# Patient Record
Sex: Female | Born: 1980 | Race: Black or African American | Hispanic: No | Marital: Single | Smoking: Former smoker
Health system: Southern US, Community
[De-identification: ages and names within clinical notes are randomized; demographics above are authoritative.]

## PROBLEM LIST (undated history)

## (undated) DIAGNOSIS — I10 Essential (primary) hypertension: Secondary | ICD-10-CM

## (undated) HISTORY — PX: HERNIA REPAIR: SHX51

---
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
## 2009-02-25 HISTORY — PX: KNEE ARTHROSCOPY: SUR90

## 2014-11-02 LAB — BASIC METABOLIC PANEL
BUN: 10 (ref 4–21)
CO2: 29 — AB (ref 13–22)
Chloride: 102 (ref 99–108)
Creatinine: 0.9 (ref 0.5–1.1)
Glucose: 93
Potassium: 4 (ref 3.4–5.3)
Sodium: 140 (ref 137–147)

## 2014-11-02 LAB — COMPREHENSIVE METABOLIC PANEL: Calcium: 8.9 (ref 8.7–10.7)

## 2015-04-13 DIAGNOSIS — R55 Syncope and collapse: Secondary | ICD-10-CM

## 2015-04-13 HISTORY — DX: Syncope and collapse: R55

## 2015-04-14 LAB — IRON,TIBC AND FERRITIN PANEL
Ferritin: 10
Iron: 13
TIBC: 311

## 2015-04-14 LAB — CBC AND DIFFERENTIAL
HCT: 30 — AB (ref 36–46)
Hemoglobin: 9 — AB (ref 12.0–16.0)
Platelets: 375 (ref 150–399)
WBC: 9.2

## 2015-04-14 LAB — CBC: RBC: 4.17 (ref 3.87–5.11)

## 2015-04-14 LAB — TSH: TSH: 1.67 (ref 0.41–5.90)

## 2016-12-25 LAB — CBC AND DIFFERENTIAL
HCT: 36 (ref 36–46)
Hemoglobin: 11.2 — AB (ref 12.0–16.0)
Platelets: 295 (ref 150–399)
WBC: 8

## 2016-12-25 LAB — BASIC METABOLIC PANEL
BUN: 15 (ref 4–21)
CO2: 23 — AB (ref 13–22)
Chloride: 103 (ref 99–108)
Creatinine: 0.9 (ref 0.5–1.1)
Glucose: 81
Potassium: 3.6 (ref 3.4–5.3)
Sodium: 134 — AB (ref 137–147)

## 2016-12-25 LAB — CBC: RBC: 4.41 (ref 3.87–5.11)

## 2016-12-25 LAB — COMPREHENSIVE METABOLIC PANEL: Calcium: 8 — AB (ref 8.7–10.7)

## 2018-01-27 LAB — BASIC METABOLIC PANEL
BUN: 19 (ref 4–21)
CO2: 27 — AB (ref 13–22)
Chloride: 104 (ref 99–108)
Creatinine: 0.9 (ref 0.5–1.1)
Glucose: 95

## 2018-01-27 LAB — CBC AND DIFFERENTIAL
HCT: 36 (ref 36–46)
Hemoglobin: 11.7 — AB (ref 12.0–16.0)
Neutrophils Absolute: 8
Platelets: 309 (ref 150–399)
WBC: 11.9

## 2018-01-27 LAB — COMPREHENSIVE METABOLIC PANEL
Albumin: 3.7 (ref 3.5–5.0)
Calcium: 8.7 (ref 8.7–10.7)

## 2018-01-27 LAB — HEPATIC FUNCTION PANEL
ALT: 14 (ref 7–35)
Alkaline Phosphatase: 66 (ref 25–125)
Bilirubin, Total: 0.2

## 2018-01-27 LAB — CBC: RBC: 4.56 (ref 3.87–5.11)

## 2018-05-05 LAB — BASIC METABOLIC PANEL
BUN: 12 (ref 4–21)
CO2: 31 — AB (ref 13–22)
Chloride: 102 (ref 99–108)
Creatinine: 1 (ref 0.5–1.1)
Glucose: 88
Potassium: 4 (ref 3.4–5.3)
Sodium: 141 (ref 137–147)

## 2018-05-05 LAB — COMPREHENSIVE METABOLIC PANEL
Calcium: 8.9 (ref 8.7–10.7)
GFR calc Af Amer: 86
GFR calc non Af Amer: 75

## 2019-03-08 ENCOUNTER — Encounter (HOSPITAL_COMMUNITY): Payer: Self-pay | Admitting: Emergency Medicine

## 2019-03-08 ENCOUNTER — Emergency Department (HOSPITAL_COMMUNITY): Payer: Medicaid Other

## 2019-03-08 ENCOUNTER — Emergency Department (HOSPITAL_COMMUNITY)
Admission: EM | Admit: 2019-03-08 | Discharge: 2019-03-09 | Disposition: A | Discharge status: Home / Self Care | Payer: Medicaid Other | Attending: Emergency Medicine | Admitting: Emergency Medicine

## 2019-03-08 ENCOUNTER — Other Ambulatory Visit: Payer: Self-pay

## 2019-03-08 DIAGNOSIS — R112 Nausea with vomiting, unspecified: Secondary | ICD-10-CM | POA: Insufficient documentation

## 2019-03-08 DIAGNOSIS — Z20822 Contact with and (suspected) exposure to covid-19: Secondary | ICD-10-CM | POA: Insufficient documentation

## 2019-03-08 DIAGNOSIS — I10 Essential (primary) hypertension: Secondary | ICD-10-CM | POA: Insufficient documentation

## 2019-03-08 DIAGNOSIS — Z87891 Personal history of nicotine dependence: Secondary | ICD-10-CM | POA: Insufficient documentation

## 2019-03-08 DIAGNOSIS — R197 Diarrhea, unspecified: Secondary | ICD-10-CM | POA: Insufficient documentation

## 2019-03-08 DIAGNOSIS — F1210 Cannabis abuse, uncomplicated: Secondary | ICD-10-CM | POA: Insufficient documentation

## 2019-03-08 HISTORY — DX: Essential (primary) hypertension: I10

## 2019-03-08 LAB — CBC
HCT: 42.2 % (ref 36.0–46.0)
Hemoglobin: 13 g/dL (ref 12.0–15.0)
MCH: 25.9 pg — ABNORMAL LOW (ref 26.0–34.0)
MCHC: 30.8 g/dL (ref 30.0–36.0)
MCV: 84.1 fL (ref 80.0–100.0)
Platelets: 342 10*3/uL (ref 150–400)
RBC: 5.02 MIL/uL (ref 3.87–5.11)
RDW: 14.6 % (ref 11.5–15.5)
WBC: 10.4 10*3/uL (ref 4.0–10.5)
nRBC: 0 % (ref 0.0–0.2)

## 2019-03-08 LAB — TROPONIN I (HIGH SENSITIVITY)
Troponin I (High Sensitivity): 6 ng/L (ref <18)
Troponin I (High Sensitivity): 6 ng/L (ref <18)

## 2019-03-08 LAB — I-STAT BETA HCG BLOOD, ED (MC, WL, AP ONLY): I-stat hCG, quantitative: <5 m[IU]/mL (ref <5)

## 2019-03-08 LAB — BASIC METABOLIC PANEL
Anion gap: 9 (ref 5–15)
BUN: 12 mg/dL (ref 6–20)
CO2: 26 mmol/L (ref 22–32)
Calcium: 8.7 mg/dL — ABNORMAL LOW (ref 8.9–10.3)
Chloride: 101 mmol/L (ref 98–111)
Creatinine, Ser: 1.02 mg/dL — ABNORMAL HIGH (ref 0.44–1.00)
GFR calc Af Amer: >60 mL/min (ref >60)
GFR calc non Af Amer: >60 mL/min (ref >60)
Glucose, Bld: 97 mg/dL (ref 70–99)
Potassium: 3.7 mmol/L (ref 3.5–5.1)
Sodium: 136 mmol/L (ref 135–145)

## 2019-03-08 NOTE — ED Triage Notes (Signed)
Pt reports "feeling bad" x 2 weeks, was at work last night and started having central CP last night with SOB, reports epigastric abdominal pain with 2 episodes of light brown emesis. Denies CP currently.

## 2019-03-09 MED ORDER — ACETAMINOPHEN 500 MG PO TABS
1000.0000 mg | ORAL_TABLET | Freq: Once | ORAL | Status: AC
  Administered 2019-03-09: 05:00:00 1000 mg via ORAL
  Filled 2019-03-09: qty 2

## 2019-03-09 MED ORDER — ONDANSETRON 4 MG PO TBDP
4.0000 mg | ORAL_TABLET | Freq: Once | ORAL | Status: AC
  Administered 2019-03-09: 05:00:00 4 mg via ORAL
  Filled 2019-03-09: qty 1

## 2019-03-09 MED ORDER — FAMOTIDINE 20 MG PO TABS
20.0000 mg | ORAL_TABLET | Freq: Once | ORAL | Status: AC
  Administered 2019-03-09: 20 mg via ORAL
  Filled 2019-03-09: qty 1

## 2019-03-09 MED ORDER — ONDANSETRON 4 MG PO TBDP: 4.0000 mg | ORAL_TABLET | Freq: Three times a day (TID) | ORAL | 0 refills | Status: DC | PRN

## 2019-03-09 MED ORDER — PANTOPRAZOLE SODIUM 40 MG PO TBEC: 40.0000 mg | DELAYED_RELEASE_TABLET | Freq: Every day | ORAL | 0 refills | Status: DC

## 2019-03-09 NOTE — ED Provider Notes (Signed)
[facility] EMERGENCY DEPARTMENT Provider Note   CSN: [id number] Arrival date & time: 03/08/19  1750     History Chief Complaint  Patient presents with  . Vomiting  . Generalized Body Aches    Diane Haas is a 39 y.o. female.  The history is provided by the patient and medical records.    39 y.o. F with hx of HTN, stomach ulcer, presenting to the ED for generalized body aches, nausea, vomiting, loose stools, and generally feeling unwell.  States she felt somewhat bad yesterday as she did not want to eat/drink but worse today while at work so she left early.  States the last thing she ate was a [employer] breakfast sandwich but that is not unusual for her and it tasted fine.  She has had approx 3 episodes of nonbloody, nonbilious emesis today.  Has also had some loose stool.  No melena or hematochezia.  She denies any fever or chills.  She has not had any cough.  She did report chest pain earlier today but that seems to have resolved, this occurred after vomiting.  She denies any known cardiac history.   She is not currently on any exogenous estrogens.  No recent travel.  No history of DVT or PE.  She denies any sick contacts or known Covid exposures.  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  She does not feel like she has lost her sense of taste/smell.  Past Medical History:  Diagnosis Date  . Hypertension   . Stomach ulcer 1999    There are no problems to display for this patient.   History reviewed. No pertinent surgical history.   OB History   No obstetric history on file.     History reviewed. No pertinent family history.  Social History   Tobacco Use  . Smoking status: Former Smoker    Quit date: 02/22/2019    Years since quitting: 0.0  Substance Use Topics  . Alcohol  use: Yes    Comment: occ  . Drug use: Yes    Types: Marijuana    Comment: occ     Home Medications Prior to Admission medications   Not on File    Allergies    Patient has no allergy information on record.  Review of Systems   Review of Systems  Constitutional: Positive for fatigue.  Gastrointestinal: Positive for abdominal pain, diarrhea, nausea and vomiting.  Musculoskeletal: Positive for myalgias.  All other systems reviewed and are negative.   Physical Exam Updated Vital Signs BP (!) 166/104 (BP Location: Left Wrist)   Pulse 67   Temp 97.8 F (36.6 C) (Oral)   Resp 20   LMP 03/01/2019 (Approximate)   SpO2 100%   Physical Exam Vitals and nursing note reviewed.  Constitutional:      Appearance: She is well-developed.     Comments: obese  HENT:     Head: Normocephalic and atraumatic.     Nose:     Comments: Sounds congested Eyes:     Conjunctiva/sclera: Conjunctivae normal.     Pupils: Pupils are equal, round, and reactive to light.  Cardiovascular:     Rate and Rhythm: Normal rate and regular rhythm.     Heart sounds: Normal heart sounds.  Pulmonary:  Effort: Pulmonary effort is normal.     Breath sounds: Normal breath sounds.  Abdominal:     General: Bowel sounds are normal.     Palpations: Abdomen is soft.     Tenderness: There is no abdominal tenderness. There is no guarding or rebound.     Comments: Soft, non-tender  Musculoskeletal:        General: Normal range of motion.     Cervical back: Normal range of motion.  Skin:    General: Skin is warm and dry.  Neurological:     Mental Status: She is alert and oriented to person, place, and time.     ED Results / Procedures / Treatments   Labs (all labs ordered are listed, but only abnormal results are displayed) Labs Reviewed  BASIC METABOLIC PANEL - Abnormal; Notable for the following components:      Result Value   Creatinine, Ser 1.02 (*)    Calcium 8.7 (*)    All other components  within normal limits  CBC - Abnormal; Notable for the following components:   MCH 25.9 (*)    All other components within normal limits  NOVEL CORONAVIRUS, NAA (HOSP ORDER, SEND-OUT TO REF LAB; TAT 18-24 HRS)  I-STAT BETA HCG BLOOD, ED ([facility], [facility], [facility] ONLY)  TROPONIN I (HIGH SENSITIVITY)  TROPONIN I (HIGH SENSITIVITY)    EKG EKG Interpretation  Date/Time:  Tuesday March 09 2019 06:12:13 EST Ventricular Rate:  64 PR Interval:  152 QRS Duration: 92 QT Interval:  426 QTC Calculation: 439 R Axis:   56 Text Interpretation: Normal sinus rhythm Normal ECG No old tracing to compare Confirmed by Merrily Pew [phone]) on 03/09/2019 6:19:58 AM   Radiology DG Chest 2 View  Result Date: 03/08/2019 CLINICAL DATA:  Chest pain EXAM: CHEST - 2 VIEW COMPARISON:  None. FINDINGS: The heart size and mediastinal contours are within normal limits. Both lungs are clear. The visualized skeletal structures are unremarkable. IMPRESSION: No active cardiopulmonary disease. Electronically Signed   By: Davina Poke D.O.   On: 03/08/2019 19:22    Procedures Procedures (including critical care time)  Medications Ordered in ED Medications - No data to display  ED Course  I have reviewed the triage vital signs and the nursing notes.  Pertinent labs & imaging results that were available during my care of the patient were reviewed by me and considered in my medical decision making (see chart for details).    MDM Rules/Calculators/A&P  39 year old female presenting to the ED with various complaints, her biggest concern is generalized body aches and vomiting.  She has not had loss of taste or smell, fever, or cough.  She did have episode of chest pain earlier today, however this was after episode of vomiting.  She denies any current chest pain.  She is afebrile and nontoxic.  She does sound congested but lungs are clear without any noted wheezes or rhonchi.  Her abdomen is soft and benign.  Still feels  nauseated with a "upset stomach".  Has been having some loose stool as well.  Denies any melena or hematochezia.  Labs obtained from triage are overall reassuring including troponin x2.  Chest x-ray is clear.  EKG without acute ischemic changes.  Patient does have reported history of stomach ulcer, however symptoms today without focal abdominal pain, denies blood in emesis or stool.  Her biggest complaint is generalized body aches which given concurrent N/V/D is morning seems more concerning for viral etiology.  Will screen for  COVID-19 and treat symptomatically.   She is tolerating oral fluids here, no active emesis or diarrhea in ED.  Feel she is stable for discharge.    She used to be on protonix for control of GERD, may be useful to restart this given her vomiting.  Continue zofran PRN, push oral fluids, rest, etc.  She can follow-up with PCP.  Return here for any new/acute changes.  Diane Haas was evaluated in Emergency Department on 03/09/2019 for the symptoms described in the history of present illness. She was evaluated in the context of the global COVID-19 pandemic, which necessitated consideration that the patient might be at risk for infection with the SARS-CoV-2 virus that causes COVID-19. Institutional protocols and algorithms that pertain to the evaluation of patients at risk for COVID-19 are in a state of rapid change based on information released by regulatory bodies including the CDC and federal and state organizations. These policies and algorithms were followed during the patient's care in the ED.   Final Clinical Impression(s) / ED Diagnoses Final diagnoses:  Non-intractable vomiting with nausea, unspecified vomiting type    Rx / DC Orders ED Discharge Orders         Ordered    ondansetron (ZOFRAN ODT) 4 MG disintegrating tablet  Every 8 hours PRN     03/09/19 0622    pantoprazole (PROTONIX) 40 MG tablet  Daily     03/09/19 0622           Garlon Hatchet, PA-C 03/09/19  4709    Marily Memos, MD 03/10/19 [phone]

## 2019-03-09 NOTE — ED Notes (Signed)
Pt states she has a stomach ulcer

## 2019-03-09 NOTE — Discharge Instructions (Signed)
Take the prescribed medication as directed.  I would continue tylenol for body aches/fever.   COVID test should come back in the next 1-2 days.  Make sure to push oral fluids. Follow-up with your primary care doctor. Return to the ED for new or worsening symptoms.

## 2019-03-10 LAB — NOVEL CORONAVIRUS, NAA (HOSP ORDER, SEND-OUT TO REF LAB; TAT 18-24 HRS): SARS-CoV-2, NAA: NOT DETECTED

## 2019-05-12 ENCOUNTER — Emergency Department (HOSPITAL_COMMUNITY): Payer: No Typology Code available for payment source

## 2019-05-12 ENCOUNTER — Emergency Department (HOSPITAL_COMMUNITY)
Admission: EM | Admit: 2019-05-12 | Discharge: 2019-05-12 | Disposition: A | Discharge status: Home / Self Care | Payer: No Typology Code available for payment source | Attending: Emergency Medicine | Admitting: Emergency Medicine

## 2019-05-12 ENCOUNTER — Other Ambulatory Visit: Payer: Self-pay

## 2019-05-12 DIAGNOSIS — I10 Essential (primary) hypertension: Secondary | ICD-10-CM | POA: Insufficient documentation

## 2019-05-12 DIAGNOSIS — R0789 Other chest pain: Secondary | ICD-10-CM | POA: Diagnosis not present

## 2019-05-12 DIAGNOSIS — R05 Cough: Secondary | ICD-10-CM | POA: Diagnosis present

## 2019-05-12 DIAGNOSIS — R0602 Shortness of breath: Secondary | ICD-10-CM | POA: Insufficient documentation

## 2019-05-12 DIAGNOSIS — R059 Cough, unspecified: Secondary | ICD-10-CM

## 2019-05-12 DIAGNOSIS — J3489 Other specified disorders of nose and nasal sinuses: Secondary | ICD-10-CM | POA: Diagnosis not present

## 2019-05-12 DIAGNOSIS — F1210 Cannabis abuse, uncomplicated: Secondary | ICD-10-CM | POA: Diagnosis not present

## 2019-05-12 DIAGNOSIS — Z87891 Personal history of nicotine dependence: Secondary | ICD-10-CM | POA: Diagnosis not present

## 2019-05-12 LAB — CBC
HCT: 42.7 % (ref 36.0–46.0)
Hemoglobin: 12.9 g/dL (ref 12.0–15.0)
MCH: 25.6 pg — ABNORMAL LOW (ref 26.0–34.0)
MCHC: 30.2 g/dL (ref 30.0–36.0)
MCV: 84.7 fL (ref 80.0–100.0)
Platelets: 328 10*3/uL (ref 150–400)
RBC: 5.04 MIL/uL (ref 3.87–5.11)
RDW: 14.9 % (ref 11.5–15.5)
WBC: 9.9 10*3/uL (ref 4.0–10.5)
nRBC: 0 % (ref 0.0–0.2)

## 2019-05-12 LAB — BASIC METABOLIC PANEL
Anion gap: 11 (ref 5–15)
BUN: 12 mg/dL (ref 6–20)
CO2: 25 mmol/L (ref 22–32)
Calcium: 8.9 mg/dL (ref 8.9–10.3)
Chloride: 104 mmol/L (ref 98–111)
Creatinine, Ser: 0.97 mg/dL (ref 0.44–1.00)
GFR calc Af Amer: >60 mL/min (ref >60)
GFR calc non Af Amer: >60 mL/min (ref >60)
Glucose, Bld: 118 mg/dL — ABNORMAL HIGH (ref 70–99)
Potassium: 4.1 mmol/L (ref 3.5–5.1)
Sodium: 140 mmol/L (ref 135–145)

## 2019-05-12 LAB — TROPONIN I (HIGH SENSITIVITY)
Troponin I (High Sensitivity): 2 ng/L (ref <18)
Troponin I (High Sensitivity): 4 ng/L (ref <18)

## 2019-05-12 LAB — D-DIMER, QUANTITATIVE: D-Dimer, Quant: <0.27 ug/mL-FEU (ref 0.00–0.50)

## 2019-05-12 MED ORDER — AEROCHAMBER PLUS FLO-VU LARGE MISC
1.0000 | Freq: Once | Status: AC
  Administered 2019-05-12: 1

## 2019-05-12 MED ORDER — AEROCHAMBER PLUS FLO-VU LARGE MISC
Status: AC
  Filled 2019-05-12: qty 1

## 2019-05-12 MED ORDER — AMOXICILLIN-POT CLAVULANATE 875-125 MG PO TABS
1.0000 | ORAL_TABLET | Freq: Once | ORAL | Status: AC
  Administered 2019-05-12: 20:00:00 1 via ORAL
  Filled 2019-05-12: qty 1

## 2019-05-12 MED ORDER — BENZONATATE 100 MG PO CAPS: 100.0000 mg | ORAL_CAPSULE | Freq: Every evening | ORAL | 0 refills | Status: DC | PRN

## 2019-05-12 MED ORDER — AMOXICILLIN-POT CLAVULANATE 875-125 MG PO TABS: 1.0000 | ORAL_TABLET | Freq: Two times a day (BID) | ORAL | 0 refills | Status: DC

## 2019-05-12 MED ORDER — ALBUTEROL SULFATE HFA 108 (90 BASE) MCG/ACT IN AERS
2.0000 | INHALATION_SPRAY | Freq: Once | RESPIRATORY_TRACT | Status: AC
  Administered 2019-05-12: 2 via RESPIRATORY_TRACT
  Filled 2019-05-12: qty 6.7

## 2019-05-12 MED ORDER — PREDNISONE 20 MG PO TABS
40.0000 mg | ORAL_TABLET | Freq: Once | ORAL | Status: AC
  Administered 2019-05-12: 40 mg via ORAL
  Filled 2019-05-12: qty 2

## 2019-05-12 NOTE — ED Triage Notes (Signed)
Pt here with continued shortness of breath with exertion or talking and central chest pain x several weeks. Multiple negative covid tests. Endorses cough, nasal congestion, and sore throat.

## 2019-05-12 NOTE — Discharge Instructions (Addendum)
Please start taking either Allegra, Claritin, or Zyrtec.  These are allergy medicines I would recommend that you take them at night. In addition please start using a nasal steroid spray such as Flonase or Nasacort. For the next week, prior to using the nasal steroid spray please perform a sinus rinse.  You can get the bottle for this at the drugstore, this will help you get better sooner.  To use the inhaler that you were given today you can take 2 puffs every 4-6 hours as needed for shortness of breath.  Please take Ibuprofen (Advil, motrin) and Tylenol (acetaminophen) to relieve your pain.  You may take up to 600 MG (3 pills) of normal strength ibuprofen every 8 hours as needed.  In between doses of ibuprofen you make take tylenol, up to 1,000 mg (two extra strength pills).  Do not take more than 3,000 mg tylenol in a 24 hour period.  Please check all medication labels as many medications such as pain and cold medications may contain tylenol.  Do not drink alcohol while taking these medications.  Do not take other NSAID'S while taking ibuprofen (such as aleve or naproxen).  Please take ibuprofen with food to decrease stomach upset.  You may have diarrhea from the antibiotics.  It is very important that you continue to take the antibiotics even if you get diarrhea unless a medical professional tells you that you may stop taking them.  If you stop too early the bacteria you are being treated for will become stronger and you may need different, more powerful antibiotics that have more side effects and worsening diarrhea.  Please stay well hydrated and consider probiotics as they may decrease the severity of your diarrhea.  Please be aware that if you take any hormonal contraception (birth control pills, nexplanon, the ring, etc) that your birth control will not work while you are taking antibiotics and you need to use back up protection as directed on the birth control medication information insert.    ACZ Sinus Nasal steroid

## 2019-05-12 NOTE — ED Provider Notes (Signed)
[facility] EMERGENCY DEPARTMENT Provider Note   CSN: [id number] Arrival date & time: 05/12/19  1446     History Chief Complaint  Patient presents with  . Chest Pain  . Shortness of Breath    Diane Haas is a 39 y.o. female with a past medical history of hypertension, obesity, who presents today for evaluation of cough and shortness of breath.  She reports that on February 22 she started developing cough.  She reports nasal congestion, sinus pressure.  She reports that about 2 weeks after this started she began developing chest pain in the middle of her chest and worsening sore throat.  She reports that originally her cough was worse at night and first thing in the morning.  She reports that she has also developed rhinorrhea and feeling like her eyes were running.  She denies any fevers.  No vomiting or diarrhea.  No constipation or abdominal pain.  She has had multiple coronavirus test, all of which have reportedly been negative.  Her chest pain started gradually and has been gradually worsening.  She feels like her chest is also tight.    HPI     Past Medical History:  Diagnosis Date  . Hypertension   . Stomach ulcer 1999    There are no problems to display for this patient.   No past surgical history on file.   OB History   No obstetric history on file.     No family history on file.  Social History   Tobacco Use  . Smoking status: Former Smoker    Quit date: 02/22/2019    Years since quitting: 0.2  Substance Use Topics  . Alcohol use: Yes    Comment: occ  . Drug use: Yes    Types: Marijuana    Comment: occ     Home Medications Prior to Admission medications   Medication Sig Start Date End Date Taking? Authorizing Provider  acetaminophen (TYLENOL) 500 MG tablet Take 1,000 mg by mouth as needed for moderate pain.   Yes [provider]  ibuprofen (ADVIL) 200 MG tablet Take 800 mg by mouth as needed for moderate pain.   Yes  [provider]  amoxicillin-clavulanate (AUGMENTIN) 875-125 MG tablet Take 1 tablet by mouth every 12 (twelve) hours. 05/12/19   Cristina Gong, PA-C  benzonatate (TESSALON) 100 MG capsule Take 1 capsule (100 mg total) by mouth at bedtime as needed for cough. 05/12/19   Cristina Gong, PA-C  ondansetron (ZOFRAN ODT) 4 MG disintegrating tablet Take 1 tablet (4 mg total) by mouth every 8 (eight) hours as needed for nausea. Patient not taking: Reported on 05/12/2019 03/09/19   Garlon Hatchet, PA-C  pantoprazole (PROTONIX) 40 MG tablet Take 1 tablet (40 mg total) by mouth daily. Patient not taking: Reported on 05/12/2019 03/09/19   Garlon Hatchet, PA-C    Allergies    Patient has no known allergies.  Review of Systems   Review of Systems  Constitutional: Positive for chills and fatigue. Negative for fever.  HENT: Positive for congestion, rhinorrhea, sinus pressure, sinus pain, sneezing and sore throat. Negative for facial swelling, mouth sores, tinnitus and trouble swallowing (After 3+ weeks of coughing).   Eyes: Negative for visual disturbance.  Respiratory: Positive for cough, chest tightness and shortness of breath.   Cardiovascular: Positive for chest pain. Negative for palpitations and leg swelling.  Gastrointestinal: Negative for abdominal pain, diarrhea, nausea and vomiting.  Genitourinary: Negative for dysuria and urgency.  Musculoskeletal: Negative for back pain and neck pain.  Skin: Negative for color change and rash.  Neurological: Negative for weakness and headaches.  Psychiatric/Behavioral: Negative for confusion.  All other systems reviewed and are negative.   Physical Exam Updated Vital Signs BP (!) 144/93   Pulse 81   Temp 98.4 F (36.9 C) (Oral)   Resp 14   LMP 05/12/2019 (Exact Date)   SpO2 95%   Physical Exam Vitals and nursing note reviewed.  Constitutional:      Appearance: She is well-developed. She is obese.  HENT:     Head:  Normocephalic and atraumatic.     Nose:     Right Turbinates: Enlarged.     Left Turbinates: Enlarged.     Right Sinus: Maxillary sinus tenderness and frontal sinus tenderness present.     Left Sinus: Maxillary sinus tenderness and frontal sinus tenderness present.  Eyes:     Conjunctiva/sclera: Conjunctivae normal.  Cardiovascular:     Rate and Rhythm: Normal rate and regular rhythm.     Heart sounds: Normal heart sounds. Heart sounds not distant. No murmur.  Pulmonary:     Effort: Pulmonary effort is normal. No respiratory distress.     Breath sounds: Normal breath sounds.  Chest:     Chest wall: Tenderness (Palpation over anterior chest both recreates and exacerbates her reported pain.) present.  Abdominal:     Palpations: Abdomen is soft.     Tenderness: There is no abdominal tenderness.  Musculoskeletal:     Cervical back: Neck supple.     Right lower leg: No tenderness. No edema.     Left lower leg: No tenderness. No edema.  Skin:    General: Skin is warm and dry.  Neurological:     Mental Status: She is alert.     ED Results / Procedures / Treatments   Labs (all labs ordered are listed, but only abnormal results are displayed) Labs Reviewed  BASIC METABOLIC PANEL - Abnormal; Notable for the following components:      Result Value   Glucose, Bld 118 (*)    All other components within normal limits  CBC - Abnormal; Notable for the following components:   MCH 25.6 (*)    All other components within normal limits  D-DIMER, QUANTITATIVE (NOT AT [facility])  I-STAT BETA HCG BLOOD, ED ([facility], [facility], [facility] ONLY)  TROPONIN I (HIGH SENSITIVITY)  TROPONIN I (HIGH SENSITIVITY)    EKG EKG Interpretation  Date/Time:  Wednesday May 12 2019 15:01:45 EDT Ventricular Rate:  97 PR Interval:  148 QRS Duration: 72 QT Interval:  330 QTC Calculation: 419 R Axis:   74 Text Interpretation: Normal sinus rhythm T wave abnormality, consider inferior ischemia , present on prior ECG Abnormal ECG  nonspecific t wave changes lateral leads Confirmed by Linwood Dibbles [id number]) on 05/12/2019 4:39:45 PM   Radiology DG Chest 2 View  Result Date: 05/12/2019 CLINICAL DATA:  Chest pain short of breath EXAM: CHEST - 2 VIEW COMPARISON:  03/08/2019 FINDINGS: The heart size and mediastinal contours are within normal limits. Both lungs are clear. The visualized skeletal structures are unremarkable. IMPRESSION: No active cardiopulmonary disease. Electronically Signed   By: Kemya Pang M.D.   On: 05/12/2019 15:39    Procedures Procedures (including critical care time)  Medications Ordered in ED Medications  albuterol (VENTOLIN HFA) 108 (90 Base) MCG/ACT inhaler 2 puff (2 puffs Inhalation Given 05/12/19 1723)  AeroChamber Plus Flo-Vu Large MISC 1 each (1 each Other Given  05/12/19 1723)  amoxicillin-clavulanate (AUGMENTIN) 875-125 MG per tablet 1 tablet (1 tablet Oral Given 05/12/19 2027)  predniSONE (DELTASONE) tablet 40 mg (40 mg Oral Given 05/12/19 2027)    ED Course  I have reviewed the triage vital signs and the nursing notes.  Pertinent labs & imaging results that were available during my care of the patient were reviewed by me and considered in my medical decision making (see chart for details).    MDM Rules/Calculators/A&P                     Patient is a 39 year old woman who presents today for evaluation of 1 month of cough.  Over the past 2 weeks she has additionally developed shortness of breath.   Here she is afebrile, not tachycardic or tachypneic.  She reports she has had multiple negative Covid tests since this started. Chest x-ray obtained without evidence of consolidation pneumonia or other abnormalities.  Labs obtained and reviewed, CBC and BMP are normal.  She did have a flight 2 days prior to the onset of her coughing, D-dimer was obtained which is not elevated.  Do not suspect DVT/PE. Troponin x2 was negative.  EKG without ischemia.  She was treated with albuterol in the emergency  room with mild relief of her shortness of breath. I suspect that she has a combination of secondary bacterial rhinosinusitis given her symptoms have been present for over 10 days, combined with costochondritis from frequent coughing and allergic rhinitis. Recommended treatment with Augmentin for bacterial sinusitis.  Additionally recommended saline nasal washes.  For her rhinitis we will treat with second-generation oral antihistamine, nasal corticosteroid spray. In addition recommended supportive care including OTC ibuprofen, Tylenol, honey and other conservative recommendations for her symptoms overall.  I suspect that once her underlying bacterial rhinosinusitis, and her rhinitis is treated that her cough will improve.  Return precautions were discussed with patient who states their understanding.  At the time of discharge patient denied any unaddressed complaints or concerns.  Patient is agreeable for discharge home.  Note: Portions of this report may have been transcribed using voice recognition software. Every effort was made to ensure accuracy; however, inadvertent computerized transcription errors may be present  Final Clinical Impression(s) / ED Diagnoses Final diagnoses:  Chest wall pain  Cough    Rx / DC Orders ED Discharge Orders         Ordered    amoxicillin-clavulanate (AUGMENTIN) 875-125 MG tablet  Every 12 hours     05/12/19 2012    benzonatate (TESSALON) 100 MG capsule  At bedtime PRN     05/12/19 2012           Lorin Glass, Hershal Coria 05/12/19 2230    Dorie Rank, MD 05/13/19 1732

## 2019-07-27 ENCOUNTER — Telehealth: Payer: Self-pay | Admitting: General Practice

## 2019-07-27 ENCOUNTER — Ambulatory Visit: Payer: No Typology Code available for payment source | Admitting: Family Medicine

## 2019-07-27 NOTE — Telephone Encounter (Signed)
Patient called and asked for address to the clinic and address was provided. Informed patient that if she was over 10 minutes late then she would have to reschedule to another day next available. Caller stated that the address was not pulling up so she didn't know where to go. Provided patient with the exact location and land marks so she could get to appointment in time. Patient then called back and stated that she was lost and was dropped off trying to get to the clinic but unable to provide her with any details because patient didn't know the exact location where she was. Patient was very angry and disconnected the call.

## 2019-07-30 ENCOUNTER — Encounter: Payer: Self-pay | Admitting: General Practice

## 2019-08-02 ENCOUNTER — Other Ambulatory Visit: Payer: Self-pay

## 2019-08-03 ENCOUNTER — Ambulatory Visit (INDEPENDENT_AMBULATORY_CARE_PROVIDER_SITE_OTHER): Payer: No Typology Code available for payment source | Admitting: Family Medicine

## 2019-08-03 ENCOUNTER — Encounter: Payer: Self-pay | Admitting: Family Medicine

## 2019-08-03 VITALS — BP 146/102 | HR 67 | Temp 98.1°F | Ht 66.5 in | Wt 336.8 lb

## 2019-08-03 DIAGNOSIS — F329 Major depressive disorder, single episode, unspecified: Secondary | ICD-10-CM

## 2019-08-03 DIAGNOSIS — E559 Vitamin D deficiency, unspecified: Secondary | ICD-10-CM

## 2019-08-03 DIAGNOSIS — Z6841 Body Mass Index (BMI) 40.0 and over, adult: Secondary | ICD-10-CM | POA: Insufficient documentation

## 2019-08-03 DIAGNOSIS — F32A Depression, unspecified: Secondary | ICD-10-CM | POA: Insufficient documentation

## 2019-08-03 DIAGNOSIS — I10 Essential (primary) hypertension: Secondary | ICD-10-CM

## 2019-08-03 DIAGNOSIS — Z0000 Encounter for general adult medical examination without abnormal findings: Secondary | ICD-10-CM | POA: Diagnosis not present

## 2019-08-03 DIAGNOSIS — M25561 Pain in right knee: Secondary | ICD-10-CM | POA: Insufficient documentation

## 2019-08-03 LAB — COMPREHENSIVE METABOLIC PANEL
ALT: 22 U/L (ref 0–35)
AST: 16 U/L (ref 0–37)
Albumin: 4 g/dL (ref 3.5–5.2)
Alkaline Phosphatase: 90 U/L (ref 39–117)
BUN: 14 mg/dL (ref 6–23)
CO2: 29 mEq/L (ref 19–32)
Calcium: 9 mg/dL (ref 8.4–10.5)
Chloride: 101 mEq/L (ref 96–112)
Creatinine, Ser: 0.79 mg/dL (ref 0.40–1.20)
GFR: 97.99 mL/min (ref >60.00)
Glucose, Bld: 99 mg/dL (ref 70–99)
Potassium: 4.6 mEq/L (ref 3.5–5.1)
Sodium: 135 mEq/L (ref 135–145)
Total Bilirubin: 0.3 mg/dL (ref 0.2–1.2)
Total Protein: 7.2 g/dL (ref 6.0–8.3)

## 2019-08-03 LAB — CBC
HCT: 39.7 % (ref 36.0–46.0)
Hemoglobin: 12.9 g/dL (ref 12.0–15.0)
MCHC: 32.5 g/dL (ref 30.0–36.0)
MCV: 80.7 fl (ref 78.0–100.0)
Platelets: 316 10*3/uL (ref 150.0–400.0)
RBC: 4.92 Mil/uL (ref 3.87–5.11)
RDW: 15.9 % — ABNORMAL HIGH (ref 11.5–15.5)
WBC: 9.1 10*3/uL (ref 4.0–10.5)

## 2019-08-03 LAB — LIPID PANEL
Cholesterol: 205 mg/dL — ABNORMAL HIGH (ref 0–200)
HDL: 40.9 mg/dL (ref >39.00)
LDL Cholesterol: 147 mg/dL — ABNORMAL HIGH (ref 0–99)
NonHDL: 164.26
Total CHOL/HDL Ratio: 5
Triglycerides: 86 mg/dL (ref 0.0–149.0)
VLDL: 17.2 mg/dL (ref 0.0–40.0)

## 2019-08-03 LAB — URINALYSIS, ROUTINE W REFLEX MICROSCOPIC
Bilirubin Urine: NEGATIVE
Hgb urine dipstick: NEGATIVE
Ketones, ur: NEGATIVE
Leukocytes,Ua: NEGATIVE
Nitrite: NEGATIVE
RBC / HPF: NONE SEEN (ref 0–?)
Specific Gravity, Urine: 1.025 (ref 1.000–1.030)
Total Protein, Urine: NEGATIVE
Urine Glucose: NEGATIVE
Urobilinogen, UA: 0.2 (ref 0.0–1.0)
pH: 5.5 (ref 5.0–8.0)

## 2019-08-03 LAB — HEMOGLOBIN A1C: Hgb A1c MFr Bld: 6.5 % (ref 4.6–6.5)

## 2019-08-03 LAB — MICROALBUMIN / CREATININE URINE RATIO
Creatinine,U: 168.7 mg/dL
Microalb Creat Ratio: 0.5 mg/g (ref 0.0–30.0)
Microalb, Ur: 0.9 mg/dL (ref 0.0–1.9)

## 2019-08-03 LAB — VITAMIN D 25 HYDROXY (VIT D DEFICIENCY, FRACTURES): VITD: <7 ng/mL — ABNORMAL LOW (ref 30.00–100.00)

## 2019-08-03 LAB — TSH: TSH: 1.61 u[IU]/mL (ref 0.35–4.50)

## 2019-08-03 MED ORDER — DICLOFENAC SODIUM 1 % EX GEL: 2.0000 g | Freq: Four times a day (QID) | CUTANEOUS | 1 refills | Status: AC

## 2019-08-03 MED ORDER — LISINOPRIL 20 MG PO TABS: 20.0000 mg | ORAL_TABLET | Freq: Every day | ORAL | 0 refills | Status: DC

## 2019-08-03 NOTE — Progress Notes (Addendum)
New Patient Office Visit  Subjective:  Patient ID: Diane Haas, female    DOB: 04-Jan-1981  Age: 39 y.o. MRN: [id number]  CC:  Chief Complaint  Patient presents with  . New Patient (Initial Visit)    Patient is here today to establish care as a new patient.  Patients main concern is to check blood for DM. States that she went to an UC for a rash and they told her that it was a sign of DM and needed to get a PCP.  She also has a H/O HTN and would like to have this evaluated as well. She is currently fasting.  . Knee Pain    Prior to COVID she was scheduled for knee surgery in [facility] and then it was cancelled. Since then she has moved here and is in need of a referral to Ortho for right knee pain. She has been going to the gym and trying to eat better to lose weight.    HPI Diane Haas presents for establishment of care with ongoing issues and concerns.  She has been lost to follow-up for quite some time and has not had medical care.  Past medical history of hypertension treated in the past.  Strong family history of diabetes on both sides.  She has never been treated for this issue.  She is fasting today.  Ongoing right knee pain.  Tells me that she had been scheduled for surgery but it was canceled due to the pandemic.  She has always struggled with her weight.  She is been depressed over the last 2 to 3 months.  She has not had a Pap or pelvic exam in some time now.  Past Medical History:  Diagnosis Date  . Hypertension   . Stomach ulcer 1999  . Syncope, vasovagal 04/13/2015    Past Surgical History:  Procedure Laterality Date  . HERNIA REPAIR     Hernia removed from ovaray when younger.  [name] KNEE ARTHROSCOPY Right 02/25/2009    Family History  Problem Relation Age of Onset  . Diabetes Mother   . Hypertension Mother   . Hyperlipidemia Mother   . Gout Father   . Hypertension Father   . Cirrhosis Sister   . Depression Sister   . Breast cancer Maternal Grandmother      Social History   Socioeconomic History  . Marital status: Single    Spouse name: Not on file  . Number of children: Not on file  . Years of education: Not on file  . Highest education level: Not on file  Occupational History  . Not on file  Tobacco Use  . Smoking status: Former Smoker    Quit date: 02/22/2019    Years since quitting: 0.4  . Smokeless tobacco: Never Used  Substance and Sexual Activity  . Alcohol use: Yes    Comment: occ  . Drug use: Yes    Types: Marijuana    Comment: occ   . Sexual activity: Not on file  Other Topics Concern  . Not on file  Social History Narrative  . Not on file   Social Determinants of Health   Financial Resource Strain:   . Difficulty of Paying Living Expenses:   Food Insecurity:   . Worried About Programme researcher, broadcasting/film/video in the Last Year:   . Barista in the Last Year:   Transportation Needs:   . Freight forwarder (Medical):   [name] Lack of Transportation (Non-Medical):  Physical Activity:   . Days of Exercise per Week:   . Minutes of Exercise per Session:   Stress:   . Feeling of Stress :   Social Connections:   . Frequency of Communication with Friends and Family:   . Frequency of Social Gatherings with Friends and Family:   . Attends Religious Services:   . Active Member of Clubs or Organizations:   . Attends Banker Meetings:   [name] Marital Status:   Intimate Partner Violence:   . Fear of Current or Ex-Partner:   . Emotionally Abused:   [name] Physically Abused:   . Sexually Abused:     ROS Review of Systems  Constitutional: Negative for diaphoresis, fatigue, fever and unexpected weight change.  HENT: Negative.   Eyes: Negative for photophobia and visual disturbance.  Respiratory: Negative.   Cardiovascular: Negative.   Gastrointestinal: Negative.   Endocrine: Negative for polyphagia and polyuria.  Genitourinary: Negative.   Musculoskeletal: Positive for arthralgias.  Skin: Negative for color  change and pallor.  Allergic/Immunologic: Negative for immunocompromised state.  Neurological: Negative for tremors and speech difficulty.  Hematological: Does not bruise/bleed easily.   Depression screen PHQ 2/9 08/03/2019  Decreased Interest 1  Down, Depressed, Hopeless 1  PHQ - 2 Score 2  Altered sleeping 3  Tired, decreased energy 1  Change in appetite 1  Feeling bad or failure about yourself  1  Trouble concentrating 1  Moving slowly or fidgety/restless 0  Suicidal thoughts 0  PHQ-9 Score 9  Difficult doing work/chores Somewhat difficult    Objective:   Today's Vitals: BP (!) 146/102 (BP Location: Left Arm, Patient Position: Sitting, Cuff Size: Large)   Pulse 67   Temp 98.1 F (36.7 C) (Temporal)   Ht 5' 6.5" (1.689 m)   Wt (!) 336 lb 12.8 oz (152.8 kg)   LMP 06/13/2019   SpO2 98%   BMI 53.55 kg/m   Physical Exam Constitutional:      General: She is not in acute distress.    Appearance: Normal appearance. She is obese. She is not ill-appearing, toxic-appearing or diaphoretic.  HENT:     Head: Normocephalic and atraumatic.     Right Ear: Tympanic membrane, ear canal and external ear normal. There is no impacted cerumen.     Left Ear: Tympanic membrane, ear canal and external ear normal. There is no impacted cerumen.     Nose: No congestion or rhinorrhea.  Eyes:     General: No scleral icterus.       Right eye: No discharge.        Left eye: No discharge.     Extraocular Movements: Extraocular movements intact.     Conjunctiva/sclera: Conjunctivae normal.     Pupils: Pupils are equal, round, and reactive to light.  Cardiovascular:     Rate and Rhythm: Normal rate and regular rhythm.  Pulmonary:     Effort: Pulmonary effort is normal.     Breath sounds: Normal breath sounds.  Abdominal:     General: Bowel sounds are normal.  Musculoskeletal:     Cervical back: Neck supple. No rigidity or tenderness.     Right knee: Swelling and effusion (small) present.  Tenderness present.     Left knee: Tenderness present over the medial joint line and lateral joint line.  Lymphadenopathy:     Cervical: No cervical adenopathy.  Skin:    General: Skin is warm and dry.  Neurological:     Mental Status: She is  alert and oriented to person, place, and time.  Psychiatric:        Mood and Affect: Mood normal.        Behavior: Behavior normal.     Assessment & Plan:   Problem List Items Addressed This Visit      Cardiovascular and Mediastinum   Essential hypertension - Primary   Relevant Medications   lisinopril (ZESTRIL) 20 MG tablet   Other Relevant Orders   CBC (Completed)   Comprehensive metabolic panel (Completed)   Urinalysis, Routine w reflex microscopic (Completed)   Microalbumin / creatinine urine ratio (Completed)     Other   Healthcare maintenance   Relevant Orders   Hemoglobin A1c (Completed)   Lipid panel (Completed)   TSH (Completed)   Ambulatory referral to Gynecology   HIV Antibody (routine testing w rflx) (Completed)   Hepatitis C antibody (Completed)   Depression   Relevant Orders   Ambulatory referral to Psychology   Right knee pain   Relevant Medications   diclofenac Sodium (VOLTAREN) 1 % GEL   Other Relevant Orders   Ambulatory referral to Sports Medicine   Morbid obesity with BMI of 50.0-59.9, adult ([place])   Relevant Orders   VITAMIN D 25 Hydroxy (Vit-D Deficiency, Fractures) (Completed)   Amb Ref to Medical Weight Management    Other Visit Diagnoses    Vitamin D deficiency       Relevant Medications   Vitamin D, Ergocalciferol, (DRISDOL) 1.25 MG (50000 UNIT) CAPS capsule      Outpatient Encounter Medications as of 08/03/2019  Medication Sig  . acetaminophen (TYLENOL) 500 MG tablet Take 1,000 mg by mouth as needed for moderate pain.  [name] ibuprofen (ADVIL) 200 MG tablet Take 800 mg by mouth as needed for moderate pain.  . pantoprazole (PROTONIX) 40 MG tablet Take 1 tablet (40 mg total) by mouth daily.  .  diclofenac Sodium (VOLTAREN) 1 % GEL Apply 2 g topically 4 (four) times daily.  [name] lisinopril (ZESTRIL) 20 MG tablet Take 1 tablet (20 mg total) by mouth daily.  . Vitamin D, Ergocalciferol, (DRISDOL) 1.25 MG (50000 UNIT) CAPS capsule Take 1 capsule (50,000 Units total) by mouth every 7 (seven) days.  . [DISCONTINUED] amoxicillin-clavulanate (AUGMENTIN) 875-125 MG tablet Take 1 tablet by mouth every 12 (twelve) hours.  . [DISCONTINUED] benzonatate (TESSALON) 100 MG capsule Take 1 capsule (100 mg total) by mouth at bedtime as needed for cough.  . [DISCONTINUED] ondansetron (ZOFRAN ODT) 4 MG disintegrating tablet Take 1 tablet (4 mg total) by mouth every 8 (eight) hours as needed for nausea. (Patient not taking: Reported on 05/12/2019)   No facility-administered encounter medications on file as of 08/03/2019.    Follow-up: Return in about 1 month (around 09/02/2019).   Libby Maw, MD

## 2019-08-03 NOTE — Patient Instructions (Signed)
Health Maintenance, Female Adopting a healthy lifestyle and getting preventive care are important in promoting health and wellness. Ask your health care provider about:  The right schedule for you to have regular tests and exams.  Things you can do on your own to prevent diseases and keep yourself healthy. What should I know about diet, weight, and exercise? Eat a healthy diet   Eat a diet that includes plenty of vegetables, fruits, low-fat dairy products, and lean protein.  Do not eat a lot of foods that are high in solid fats, added sugars, or sodium. Maintain a healthy weight Body mass index (BMI) is used to identify weight problems. It estimates body fat based on height and weight. Your health care provider can help determine your BMI and help you achieve or maintain a healthy weight. Get regular exercise Get regular exercise. This is one of the most important things you can do for your health. Most adults should:  Exercise for at least 150 minutes each week. The exercise should increase your heart rate and make you sweat (moderate-intensity exercise).  Do strengthening exercises at least twice a week. This is in addition to the moderate-intensity exercise.  Spend less time sitting. Even light physical activity can be beneficial. Watch cholesterol and blood lipids Have your blood tested for lipids and cholesterol at 39 years of age, then have this test every 5 years. Have your cholesterol levels checked more often if:  Your lipid or cholesterol levels are high.  You are older than 39 years of age.  You are at high risk for heart disease. What should I know about cancer screening? Depending on your health history and family history, you may need to have cancer screening at various ages. This may include screening for:  Breast cancer.  Cervical cancer.  Colorectal cancer.  Skin cancer.  Lung cancer. What should I know about heart disease, diabetes, and high blood  pressure? Blood pressure and heart disease  High blood pressure causes heart disease and increases the risk of stroke. This is more likely to develop in people who have high blood pressure readings, are of African descent, or are overweight.  Have your blood pressure checked: ? Every 3-5 years if you are 39-39 years of age. ? Every year if you are 39 years old or older. Diabetes Have regular diabetes screenings. This checks your fasting blood sugar level. Have the screening done:  Once every three years after age 69 if you are at a normal weight and have a low risk for diabetes.  More often and at a younger age if you are overweight or have a high risk for diabetes. What should I know about preventing infection? Hepatitis B If you have a higher risk for hepatitis B, you should be screened for this virus. Talk with your health care provider to find out if you are at risk for hepatitis B infection. Hepatitis C Testing is recommended for:  Everyone born from [birth year] through 1965.  Anyone with known risk factors for hepatitis C. Sexually transmitted infections (STIs)  Get screened for STIs, including gonorrhea and chlamydia, if: ? You are sexually active and are younger than 39 years of age. ? You are older than 39 years of age and your health care provider tells you that you are at risk for this type of infection. ? Your sexual activity has changed since you were last screened, and you are at increased risk for chlamydia or gonorrhea. Ask your health care provider if  you are at risk.  Ask your health care provider about whether you are at high risk for HIV. Your health care provider may recommend a prescription medicine to help prevent HIV infection. If you choose to take medicine to prevent HIV, you should first get tested for HIV. You should then be tested every 3 months for as long as you are taking the medicine. Pregnancy  If you are about to stop having your period (premenopausal) and  you may become pregnant, seek counseling before you get pregnant.  Take 400 to 800 micrograms (mcg) of folic acid every day if you become pregnant.  Ask for birth control (contraception) if you want to prevent pregnancy. Osteoporosis and menopause Osteoporosis is a disease in which the bones lose minerals and strength with aging. This can result in bone fractures. If you are 9 years old or older, or if you are at risk for osteoporosis and fractures, ask your health care provider if you should:  Be screened for bone loss.  Take a calcium or vitamin D supplement to lower your risk of fractures.  Be given hormone replacement therapy (HRT) to treat symptoms of menopause. Follow these instructions at home: Lifestyle  Do not use any products that contain nicotine or tobacco, such as cigarettes, e-cigarettes, and chewing tobacco. If you need help quitting, ask your health care provider.  Do not use street drugs.  Do not share needles.  Ask your health care provider for help if you need support or information about quitting drugs. Alcohol use  Do not drink alcohol if: ? Your health care provider tells you not to drink. ? You are pregnant, may be pregnant, or are planning to become pregnant.  If you drink alcohol: ? Limit how much you use to 0-1 drink a day. ? Limit intake if you are breastfeeding.  Be aware of how much alcohol is in your drink. In the [place] one drink equals one 12 oz bottle of beer (355 mL), one 5 oz glass of wine (148 mL), or one 1 oz glass of hard liquor (44 mL). General instructions  Schedule regular health, dental, and eye exams.  Stay current with your vaccines.  Tell your health care provider if: ? You often feel depressed. ? You have ever been abused or do not feel safe at home. Summary  Adopting a healthy lifestyle and getting preventive care are important in promoting health and wellness.  Follow your health care provider's instructions about healthy  diet, exercising, and getting tested or screened for diseases.  Follow your health care provider's instructions on monitoring your cholesterol and blood pressure. This information is not intended to replace advice given to you by your health care provider. Make sure you discuss any questions you have with your health care provider. Document Revised: 02/04/2018 Document Reviewed: 02/04/2018 Elsevier Patient Education  2020 [place] 93-58 Years Old, Female Preventive care refers to visits with your health care provider and lifestyle choices that can promote health and wellness. This includes:  A yearly physical exam. This may also be called an annual well check.  Regular dental visits and eye exams.  Immunizations.  Screening for certain conditions.  Healthy lifestyle choices, such as eating a healthy diet, getting regular exercise, not using drugs or products that contain nicotine and tobacco, and limiting alcohol use. What can I expect for my preventive care visit? Physical exam Your health care provider will check your:  Height and weight. This may be used  to calculate body mass index (BMI), which tells if you are at a healthy weight.  Heart rate and blood pressure.  Skin for abnormal spots. Counseling Your health care provider may ask you questions about your:  Alcohol, tobacco, and drug use.  Emotional well-being.  Home and relationship well-being.  Sexual activity.  Eating habits.  Work and work Statistician.  Method of birth control.  Menstrual cycle.  Pregnancy history. What immunizations do I need?  Influenza (flu) vaccine  This is recommended every year. Tetanus, diphtheria, and pertussis (Tdap) vaccine  You may need a Td booster every 10 years. Varicella (chickenpox) vaccine  You may need this if you have not been vaccinated. Human papillomavirus (HPV) vaccine  If recommended by your health care provider, you may need three  doses over 6 months. Measles, mumps, and rubella (MMR) vaccine  You may need at least one dose of MMR. You may also need a second dose. Meningococcal conjugate (MenACWY) vaccine  One dose is recommended if you are age 46-21 years and a first-year college student living in a residence hall, or if you have one of several medical conditions. You may also need additional booster doses. Pneumococcal conjugate (PCV13) vaccine  You may need this if you have certain conditions and were not previously vaccinated. Pneumococcal polysaccharide (PPSV23) vaccine  You may need one or two doses if you smoke cigarettes or if you have certain conditions. Hepatitis A vaccine  You may need this if you have certain conditions or if you travel or work in places where you may be exposed to hepatitis A. Hepatitis B vaccine  You may need this if you have certain conditions or if you travel or work in places where you may be exposed to hepatitis B. Haemophilus influenzae type b (Hib) vaccine  You may need this if you have certain conditions. You may receive vaccines as individual doses or as more than one vaccine together in one shot (combination vaccines). Talk with your health care provider about the risks and benefits of combination vaccines. What tests do I need?  Blood tests  Lipid and cholesterol levels. These may be checked every 5 years starting at age 12.  Hepatitis C test.  Hepatitis B test. Screening  Diabetes screening. This is done by checking your blood sugar (glucose) after you have not eaten for a while (fasting).  Sexually transmitted disease (STD) testing.  BRCA-related cancer screening. This may be done if you have a family history of breast, ovarian, tubal, or peritoneal cancers.  Pelvic exam and Pap test. This may be done every 3 years starting at age 28. Starting at age 37, this may be done every 5 years if you have a Pap test in combination with an HPV test. Talk with your  health care provider about your test results, treatment options, and if necessary, the need for more tests. Follow these instructions at home: Eating and drinking   Eat a diet that includes fresh fruits and vegetables, whole grains, lean protein, and low-fat dairy.  Take vitamin and mineral supplements as recommended by your health care provider.  Do not drink alcohol if: ? Your health care provider tells you not to drink. ? You are pregnant, may be pregnant, or are planning to become pregnant.  If you drink alcohol: ? Limit how much you have to 0-1 drink a day. ? Be aware of how much alcohol is in your drink. In the [place] one drink equals one 12 oz bottle of beer (  355 mL), one 5 oz glass of wine (148 mL), or one 1 oz glass of hard liquor (44 mL). Lifestyle  Take daily care of your teeth and gums.  Stay active. Exercise for at least 30 minutes on 5 or more days each week.  Do not use any products that contain nicotine or tobacco, such as cigarettes, e-cigarettes, and chewing tobacco. If you need help quitting, ask your health care provider.  If you are sexually active, practice safe sex. Use a condom or other form of birth control (contraception) in order to prevent pregnancy and STIs (sexually transmitted infections). If you plan to become pregnant, see your health care provider for a preconception visit. What's next?  Visit your health care provider once a year for a well check visit.  Ask your health care provider how often you should have your eyes and teeth checked.  Stay up to date on all vaccines. This information is not intended to replace advice given to you by your health care provider. Make sure you discuss any questions you have with your health care provider. Document Revised: 10/23/2017 Document Reviewed: 10/23/2017 Elsevier Patient Education  [place].  Obesity, Adult Obesity is having too much body fat. Being obese means that your weight is more than  what is healthy for you. BMI is a number that explains how much body fat you have. If you have a BMI of 30 or more, you are obese. Obesity is often caused by eating or drinking more calories than your body uses. Changing your lifestyle can help you lose weight. Obesity can cause serious health problems, such as:  Stroke.  Coronary artery disease (CAD).  Type 2 diabetes.  Some types of cancer, including cancers of the colon, breast, uterus, and gallbladder.  Osteoarthritis.  High blood pressure (hypertension).  High cholesterol.  Sleep apnea.  Gallbladder stones.  Infertility problems. What are the causes?  Eating meals each day that are high in calories, sugar, and fat.  Being born with genes that may make you more likely to become obese.  Having a medical condition that causes obesity.  Taking certain medicines.  Sitting a lot (having a sedentary lifestyle).  Not getting enough sleep.  Drinking a lot of drinks that have sugar in them. What increases the risk?  Having a family history of obesity.  Being an [place] American woman.  Being a Hispanic man.  Living in an area with limited access to: ? Romilda Garret, recreation centers, or sidewalks. ? Healthy food choices, such as grocery stores and farmers' markets. What are the signs or symptoms? The main sign is having too much body fat. How is this treated?  Treatment for this condition often includes changing your lifestyle. Treatment may include: ? Changing your diet. This may include making a healthy meal plan. ? Exercise. This may include activity that causes your heart to beat faster (aerobic exercise) and strength training. Work with your doctor to design a program that works for you. ? Medicine to help you lose weight. This may be used if you are not able to lose 1 pound a week after 6 weeks of healthy eating and more exercise. ? Treating conditions that cause the obesity. ? Surgery. Options may include gastric  banding and gastric bypass. This may be done if:  Other treatments have not helped to improve your condition.  You have a BMI of 40 or higher.  You have life-threatening health problems related to obesity. Follow these instructions at home: Eating  and drinking   Follow advice from your doctor about what to eat and drink. Your doctor may tell you to: ? Limit fast food, sweets, and processed snack foods. ? Choose low-fat options. For example, choose low-fat milk instead of whole milk. ? Eat 5 or more servings of fruits or vegetables each day. ? Eat at home more often. This gives you more control over what you eat. ? Choose healthy foods when you eat out. ? Learn to read food labels. This will help you learn how much food is in 1 serving. ? Keep low-fat snacks available. ? Avoid drinks that have a lot of sugar in them. These include soda, fruit juice, iced tea with sugar, and flavored milk.  Drink enough water to keep your pee (urine) pale yellow.  Do not go on fad diets. Physical activity  Exercise often, as told by your doctor. Most adults should get up to 150 minutes of moderate-intensity exercise every week.Ask your doctor: ? What types of exercise are safe for you. ? How often you should exercise.  Warm up and stretch before being active.  Do slow stretching after being active (cool down).  Rest between times of being active. Lifestyle  Work with your doctor and a food expert (dietitian) to set a weight-loss goal that is best for you.  Limit your screen time.  Find ways to reward yourself that do not involve food.  Do not drink alcohol if: ? Your doctor tells you not to drink. ? You are pregnant, may be pregnant, or are planning to become pregnant.  If you drink alcohol: ? Limit how much you use to:  0-1 drink a day for women.  0-2 drinks a day for men. ? Be aware of how much alcohol is in your drink. In the [place] one drink equals one 12 oz bottle of beer (355  mL), one 5 oz glass of wine (148 mL), or one 1 oz glass of hard liquor (44 mL). General instructions  Keep a weight-loss journal. This can help you keep track of: ? The food that you eat. ? How much exercise you get.  Take over-the-counter and prescription medicines only as told by your doctor.  Take vitamins and supplements only as told by your doctor.  Think about joining a support group.  Keep all follow-up visits as told by your doctor. This is important. Contact a doctor if:  You cannot meet your weight loss goal after you have changed your diet and lifestyle for 6 weeks. Get help right away if you:  Are having trouble breathing.  Are having thoughts of harming yourself. Summary  Obesity is having too much body fat.  Being obese means that your weight is more than what is healthy for you.  Work with your doctor to set a weight-loss goal.  Get regular exercise as told by your doctor. This information is not intended to replace advice given to you by your health care provider. Make sure you discuss any questions you have with your health care provider. Document Revised: 10/16/2017 Document Reviewed: 10/16/2017 Elsevier Patient Education  2020 [place] With Depression Everyone experiences occasional disappointment, sadness, and loss in their lives. When you are feeling down, blue, or sad for at least 2 weeks in a row, it may mean that you have depression. Depression can affect your thoughts and feelings, relationships, daily activities, and physical health. It is caused by changes in the way your brain functions. If you receive a  diagnosis of depression, your health care provider will tell you which type of depression you have and what treatment options are available to you. If you are living with depression, there are ways to help you recover from it and also ways to prevent it from coming back. How to cope with lifestyle changes Coping with stress      Stress is your body's reaction to life changes and events, both good and bad. Stressful situations may include:  Getting married.  The death of a spouse.  Losing a job.  Retiring.  Having a baby. Stress can last just a few hours or it can be ongoing. Stress can play a major role in depression, so it is important to learn both how to cope with stress and how to think about it differently. Talk with your health care provider or a counselor if you would like to learn more about stress reduction. He or she may suggest some stress reduction techniques, such as:  Music therapy. This can include creating music or listening to music. Choose music that you enjoy and that inspires you.  Mindfulness-based meditation. This kind of meditation can be done while sitting or walking. It involves being aware of your normal breaths, rather than trying to control your breathing.  Centering prayer. This is a kind of meditation that involves focusing on a spiritual word or phrase. Choose a word, phrase, or sacred image that is meaningful to you and that brings you peace.  Deep breathing. To do this, expand your stomach and inhale slowly through your nose. Hold your breath for 3-5 seconds, then exhale slowly, allowing your stomach muscles to relax.  Muscle relaxation. This involves intentionally tensing muscles then relaxing them. Choose a stress reduction technique that fits your lifestyle and personality. Stress reduction techniques take time and practice to develop. Set aside 5-15 minutes a day to do them. Therapists can offer training in these techniques. The training may be covered by some insurance plans. Other things you can do to manage stress include:  Keeping a stress diary. This can help you learn what triggers your stress and ways to control your response.  Understanding what your limits are and saying no to requests or events that lead to a schedule that is too full.  Thinking about how you  respond to certain situations. You may not be able to control everything, but you can control how you react.  Adding humor to your life by watching funny films or TV shows.  Making time for activities that help you relax and not feeling guilty about spending your time this way.  Medicines Your health care provider may suggest certain medicines if he or she feels that they will help improve your condition. Avoid using alcohol and other substances that may prevent your medicines from working properly (may interact). It is also important to:  Talk with your pharmacist or health care provider about all the medicines that you take, their possible side effects, and what medicines are safe to take together.  Make it your goal to take part in all treatment decisions (shared decision-making). This includes giving input on the side effects of medicines. It is best if shared decision-making with your health care provider is part of your total treatment plan. If your health care provider prescribes a medicine, you may not notice the full benefits of it for 4-8 weeks. Most people who are treated for depression need to be on medicine for at least 6-12 months after they feel  better. If you are taking medicines as part of your treatment, do not stop taking medicines without first talking to your health care provider. You may need to have the medicine slowly decreased (tapered) over time to decrease the risk of harmful side effects. Relationships Your health care provider may suggest family therapy along with individual therapy and drug therapy. While there may not be family problems that are causing you to feel depressed, it is still important to make sure your family learns as much as they can about your mental health. Having your family's support can help make your treatment successful. How to recognize changes in your condition Everyone has a different response to treatment for depression. Recovery from major  depression happens when you have not had signs of major depression for two months. This may mean that you will start to:  Have more interest in doing activities.  Feel less hopeless than you did 2 months ago.  Have more energy.  Overeat less often, or have better or improving appetite.  Have better concentration. Your health care provider will work with you to decide the next steps in your recovery. It is also important to recognize when your condition is getting worse. Watch for these signs:  Having fatigue or low energy.  Eating too much or too little.  Sleeping too much or too little.  Feeling restless, agitated, or hopeless.  Having trouble concentrating or making decisions.  Having unexplained physical complaints.  Feeling irritable, angry, or aggressive. Get help as soon as you or your family members notice these symptoms coming back. How to get support and help from others How to talk with friends and family members about your condition  Talking to friends and family members about your condition can provide you with one way to get support and guidance. Reach out to trusted friends or family members, explain your symptoms to them, and let them know that you are working with a health care provider to treat your depression. Financial resources Not all insurance plans cover mental health care, so it is important to check with your insurance carrier. If paying for co-pays or counseling services is a problem, search for a local or county mental health care center. They may be able to offer public mental health care services at low or no cost when you are not able to see a private health care provider. If you are taking medicine for depression, you may be able to get the generic form, which may be less expensive. Some makers of prescription medicines also offer help to patients who cannot afford the medicines they need. Follow these instructions at home:   Get the right amount  and quality of sleep.  Cut down on using caffeine, tobacco, alcohol, and other potentially harmful substances.  Try to exercise, such as walking or lifting small weights.  Take over-the-counter and prescription medicines only as told by your health care provider.  Eat a healthy diet that includes plenty of vegetables, fruits, whole grains, low-fat dairy products, and lean protein. Do not eat a lot of foods that are high in solid fats, added sugars, or salt.  Keep all follow-up visits as told by your health care provider. This is important. Contact a health care provider if:  You stop taking your antidepressant medicines, and you have any of these symptoms: ? Nausea. ? Headache. ? Feeling lightheaded. ? Chills and body aches. ? Not being able to sleep (insomnia).  You or your friends and family think your  depression is getting worse. Get help right away if:  You have thoughts of hurting yourself or others. If you ever feel like you may hurt yourself or others, or have thoughts about taking your own life, get help right away. You can go to your nearest emergency department or call:  Your local emergency services (911 in the [place]  A suicide crisis helpline, such as the [facility] at [phone]. This is open 24-hours a day. Summary  If you are living with depression, there are ways to help you recover from it and also ways to prevent it from coming back.  Work with your health care team to create a management plan that includes counseling, stress management techniques, and healthy lifestyle habits. This information is not intended to replace advice given to you by your health care provider. Make sure you discuss any questions you have with your health care provider. Document Revised: 06/05/2018 Document Reviewed: 01/15/2016 Elsevier Patient Education  [place].

## 2019-08-04 LAB — HEPATITIS C ANTIBODY
Hepatitis C Ab: NONREACTIVE
SIGNAL TO CUT-OFF: 0.01 (ref <1.00)

## 2019-08-04 LAB — HIV ANTIBODY (ROUTINE TESTING W REFLEX): HIV 1&2 Ab, 4th Generation: NONREACTIVE

## 2019-08-06 ENCOUNTER — Telehealth: Payer: Self-pay | Admitting: Family Medicine

## 2019-08-06 ENCOUNTER — Encounter: Payer: Self-pay | Admitting: Family Medicine

## 2019-08-06 MED ORDER — VITAMIN D (ERGOCALCIFEROL) 1.25 MG (50000 UNIT) PO CAPS: 50000.0000 [IU] | ORAL_CAPSULE | ORAL | 5 refills | Status: DC

## 2019-08-06 NOTE — Telephone Encounter (Signed)
Patient is calling and requesting a call back regarding her lab results and medication that was sent to the pharmacy. CB is [phone].

## 2019-08-06 NOTE — Addendum Note (Signed)
Addended by: Andrez Grime on: 08/06/2019 07:55 AM   Modules accepted: Orders

## 2019-08-09 ENCOUNTER — Telehealth: Payer: Self-pay | Admitting: Family Medicine

## 2019-08-09 DIAGNOSIS — I10 Essential (primary) hypertension: Secondary | ICD-10-CM

## 2019-08-09 NOTE — Telephone Encounter (Signed)
Pt calling to get refill on lisinopril (ZESTRIL) 20 MG tablet, please advise

## 2019-08-10 MED ORDER — LISINOPRIL 20 MG PO TABS: 20.0000 mg | ORAL_TABLET | Freq: Every day | ORAL | 1 refills | Status: DC

## 2019-08-10 NOTE — Telephone Encounter (Signed)
Returned patients call, no answer LMTCB 

## 2019-08-10 NOTE — Telephone Encounter (Signed)
Patient is calling back to check the status of medication refill for lisinopril, please advise. CB is [phone]

## 2019-08-10 NOTE — Telephone Encounter (Signed)
WK-Pt received her Lisinopril but lost the whole bottle/she is asking if we can send in another prescription please/thx dmf

## 2019-08-11 ENCOUNTER — Other Ambulatory Visit: Payer: Self-pay

## 2019-08-11 ENCOUNTER — Ambulatory Visit (INDEPENDENT_AMBULATORY_CARE_PROVIDER_SITE_OTHER): Payer: No Typology Code available for payment source | Admitting: Family Medicine

## 2019-08-11 ENCOUNTER — Encounter: Payer: Self-pay | Admitting: Family Medicine

## 2019-08-11 ENCOUNTER — Ambulatory Visit (INDEPENDENT_AMBULATORY_CARE_PROVIDER_SITE_OTHER): Payer: No Typology Code available for payment source

## 2019-08-11 ENCOUNTER — Ambulatory Visit: Payer: Self-pay

## 2019-08-11 VITALS — BP 124/92 | HR 73 | Ht 66.5 in | Wt 337.8 lb

## 2019-08-11 DIAGNOSIS — R700 Elevated erythrocyte sedimentation rate: Secondary | ICD-10-CM

## 2019-08-11 DIAGNOSIS — M79641 Pain in right hand: Secondary | ICD-10-CM

## 2019-08-11 DIAGNOSIS — R768 Other specified abnormal immunological findings in serum: Secondary | ICD-10-CM

## 2019-08-11 DIAGNOSIS — M25562 Pain in left knee: Secondary | ICD-10-CM

## 2019-08-11 DIAGNOSIS — M79642 Pain in left hand: Secondary | ICD-10-CM

## 2019-08-11 DIAGNOSIS — M25561 Pain in right knee: Secondary | ICD-10-CM

## 2019-08-11 DIAGNOSIS — G8929 Other chronic pain: Secondary | ICD-10-CM

## 2019-08-11 LAB — CK: Total CK: 133 U/L (ref 7–177)

## 2019-08-11 LAB — URIC ACID: Uric Acid, Serum: 7.6 mg/dL — ABNORMAL HIGH (ref 2.4–7.0)

## 2019-08-11 LAB — SEDIMENTATION RATE: Sed Rate: 95 mm/hr — ABNORMAL HIGH (ref 0–20)

## 2019-08-11 NOTE — Patient Instructions (Addendum)
Thank you for coming in today. We will request records from Dr Jaymes Graff Plan for labs and xray today.  Recheck with me in 1 month.  Contact [facility] about their bariatric surgery service.  Let me know if you need anything.   Call or go to the ER if you develop a large red swollen joint with extreme pain or oozing puss.

## 2019-08-11 NOTE — Progress Notes (Signed)
Subjective:    I'm seeing this patient as a consultation for:  Dr. Doreene Burke. Note will be routed back to referring provider/PCP.  CC: B knee pain, R>L  I, Molly Weber, LAT, ATC, am serving as scribe for Dr. Clementeen Graham.  HPI: Pt is a 39 y/o female presenting w/ c/o chronic R knee pain.  She locates her pain to her R anterior knee.  She reports having had R knee surgery in 2011.  She was scheduled for knee surgery in [facility] Dr. Vilma Prader in 2020 prior to COVID-19 but it was delayed and canceled due to the pandemic.  She is since moved to [place] area.  She notes daily pain and some clicking or catching in her knee.  She also notes right hand pain and soreness and swelling and stiffness.  She notes a positive family history for rheumatologic disease but has never had a rheumatologic work-up.  Additionally she recognizes that her morbid obesity is a factor in her knee pain.  She is tried to lose weight in the past but has had difficulty.  She is interested in bariatric surgery.    [address] [facility] [address] Vilma Prader [phone] fax [phone]  Radiating pain: yes into her R thigh R knee swelling: yes R knee mechanical symptoms: intermittently Aggravating factors: walking; transitioning from sit-to-stand; prolonged sitting Treatments tried: knee brace; Voltaren gel  Past medical history, Surgical history, Family history, Social history, Allergies, and medications have been entered into the medical record, reviewed.   Review of Systems: No new headache, visual changes, nausea, vomiting, diarrhea, constipation, dizziness, abdominal pain, skin rash, fevers, chills, night sweats, weight loss, swollen lymph nodes, body aches, joint swelling, muscle aches, chest pain, shortness of breath, mood changes, visual or auditory hallucinations.   Objective:    Vitals:   08/11/19 0851  BP: (!) 124/92  Pulse: 73  SpO2: 99%   General: Well Developed, morbidly obese, and in no acute distress.    Neuro/Psych: Alert and oriented x3, extra-ocular muscles intact, able to move all 4 extremities, sensation grossly intact. Skin: Warm and dry, no rashes noted.  Respiratory: Not using accessory muscles, speaking in full sentences, trachea midline.  Cardiovascular: Pulses palpable, no extremity edema. Abdomen: Does not appear distended. MSK: Right hand slight swelling at MCP otherwise normal-appearing with no ulnar deviation.  Normal hand motion.  Not particularly tender normal strength.  Negative Tinel's at wrist.  Right knee mild effusion otherwise normal-appearing Mild tender palpation medial and lateral joint line. Moderate crepitation with extension. Range of motion 0-120 degrees Stable ligamentous exam. Positive McMurray's test.  Left knee normal-appearing Range of motion 0-120 degrees with crepitation. Tender palpation medial and lateral joint line. Stable ligamentous exam.   Lab and Radiology Results:   X-ray images bilateral knees and right hand obtained today personally and independently reviewed  Right knee: Mild DJD.  Osteophyte superior pole of patella.  No acute fractures.  Left knee: Mild DJD worse medial compartment.  Osteophyte superior pole patella.  No acute fractures.  Right hand: No acute fractures.  No significant degenerative changes no erosions at MCPs.  Await formal radiology review  Procedure: Real-time Ultrasound Guided Injection of right knee superior lateral patellar space Device: Philips Affiniti 50G Images permanently stored and available for review in the ultrasound unit. Verbal informed consent obtained.  Discussed risks and benefits of procedure. Warned about infection bleeding damage to structures skin hypopigmentation and fat atrophy among others. Patient expresses understanding and agreement Time-out conducted.   Noted no  overlying erythema, induration, or other signs of local infection.   Skin prepped in a sterile fashion.   Local  anesthesia: Topical Ethyl chloride.   With sterile technique and under real time ultrasound guidance:  40 mg of Kenalog and 2 mL of Marcaine injected easily.   Completed without difficulty   Pain immediately resolved suggesting accurate placement of the medication.   Advised to call if fevers/chills, erythema, induration, drainage, or persistent bleeding.   Images permanently stored and available for review in the ultrasound unit.  Impression: Technically successful ultrasound guided injection.     Impression and Recommendations:    Assessment and Plan: 39 y.o. female with  Bilateral knee pain right worse than left.  History of something in her knee that was bad enough to consider surgery.  Suspect probably was a meniscus tear however I am requesting records from Dr. Vito Berger at [place].  Plan for injection right knee today and recheck in about a month.  Right hand pain unclear etiology concerning for rheumatologic process.  Plan for basic rheumatologic work-up listed below and reassess in 1 month.  Morbid obesity with BMI 53.7.  Main driver for knee pain.  Discussed options.  She at this point is probably well suited for a [place] surgery consultation.  Recommend [facility].  PDMP not reviewed this encounter. Orders Placed This Encounter  Procedures  . [place] LIMITED JOINT SPACE STRUCTURES LOW BILAT(NO LINKED CHARGES)    Order Specific Question:   Reason for Exam (SYMPTOM  OR DIAGNOSIS REQUIRED)    Answer:   Bilateral knee pain    Order Specific Question:   Preferred imaging location?    Answer:   [place]  . DG Knee AP/LAT W/Sunrise Left    Standing Status:   Future    Number of Occurrences:   1    Standing Expiration Date:   08/10/2020    Order Specific Question:   Reason for Exam (SYMPTOM  OR DIAGNOSIS REQUIRED)    Answer:   eval knee pain b    Order Specific Question:   Is patient pregnant?    Answer:   No    Order  Specific Question:   Preferred imaging location?    Answer:   [facility]    Order Specific Question:   Radiology Contrast Protocol - do NOT remove file path    Answer:   \\[email or web address]\[email or web address]\Radiant\DXFluoroContrastProtocols.pdf  . DG Knee AP/LAT W/Sunrise Right    Standing Status:   Future    Number of Occurrences:   1    Standing Expiration Date:   08/10/2020    Order Specific Question:   Reason for Exam (SYMPTOM  OR DIAGNOSIS REQUIRED)    Answer:   eval knee pain b    Order Specific Question:   Is patient pregnant?    Answer:   No    Order Specific Question:   Preferred imaging location?    Answer:   [facility]    Order Specific Question:   Radiology Contrast Protocol - do NOT remove file path    Answer:   \\[email or web address]\Radiant\DXFluoroContrastProtocols.pdf  . DG Hand Complete Right    Standing Status:   Future    Number of Occurrences:   1    Standing Expiration Date:   08/10/2020    Order Specific Question:   Reason for Exam (SYMPTOM  OR DIAGNOSIS REQUIRED)    Answer:   right hand pain    Order Specific  Question:   Is patient pregnant?    Answer:   No    Order Specific Question:   Preferred imaging location?    Answer:   [facility]    Order Specific Question:   Radiology Contrast Protocol - do NOT remove file path    Answer:   \\[email or web address]\Radiant\DXFluoroContrastProtocols.pdf  . Sedimentation rate    Standing Status:   Future    Number of Occurrences:   1    Standing Expiration Date:   08/10/2020  . Rheumatoid factor    Standing Status:   Future    Number of Occurrences:   1    Standing Expiration Date:   08/10/2020  . ANA    Standing Status:   Future    Number of Occurrences:   1    Standing Expiration Date:   08/10/2020  . Cyclic citrul peptide antibody, IgG    Standing Status:   Future    Number of Occurrences:   1    Standing Expiration Date:   08/10/2020  . CK    Standing Status:   Future    Number of Occurrences:   1     Standing Expiration Date:   08/10/2020  . Uric acid    Standing Status:   Future    Number of Occurrences:   1    Standing Expiration Date:   08/10/2020   No orders of the defined types were placed in this encounter.   Discussed warning signs or symptoms. Please see discharge instructions. Patient expresses understanding.   The above documentation has been reviewed and is accurate and complete Clementeen Graham, M.D.

## 2019-08-12 NOTE — Progress Notes (Signed)
Uric acid is a little bit elevated at 7.6.  Sedimentation rate is significantly elevated at 95.  Sedimentation rate is a marker of general inflammation.  CK a marker of muscle inflammation is normal.  Multiple other rheumatology labs are still pending

## 2019-08-12 NOTE — Progress Notes (Signed)
X-ray left knee looks normal to radiology.  Not a lot of arthritis at all.

## 2019-08-12 NOTE — Progress Notes (Signed)
X-ray right hand looks pretty normal

## 2019-08-12 NOTE — Progress Notes (Signed)
X-ray right knee shows mild arthritis.  We have sent off the records request from [facility] orthopedics.  That should include an MRI report which should help dictate next steps.

## 2019-08-13 LAB — ANA: Anti Nuclear Antibody (ANA): POSITIVE — AB

## 2019-08-13 LAB — CYCLIC CITRUL PEPTIDE ANTIBODY, IGG: Cyclic Citrullin Peptide Ab: <16 UNITS

## 2019-08-13 LAB — RHEUMATOID FACTOR: Rheumatoid fact SerPl-aCnc: <14 IU/mL (ref <14)

## 2019-08-13 LAB — ANTI-NUCLEAR AB-TITER (ANA TITER): ANA Titer 1: 1:80 {titer} — ABNORMAL HIGH

## 2019-08-13 NOTE — Progress Notes (Signed)
ANA a marker for lupus is positive.  This associated with elevated sedimentation rate is concerning for a rheumatologic disease.  I think it is reasonable to have evaluation with rheumatology.  I will place referral to [facility].  You should hear from their practice in 1 to 2 weeks.  Let me know if you do not hear anything.

## 2019-08-13 NOTE — Addendum Note (Signed)
Addended by: Rodolph Bong on: 08/13/2019 04:34 PM   Modules accepted: Orders

## 2019-08-26 ENCOUNTER — Encounter: Payer: Self-pay | Admitting: Family Medicine

## 2019-08-26 ENCOUNTER — Ambulatory Visit: Payer: No Typology Code available for payment source | Admitting: Psychology

## 2019-08-26 ENCOUNTER — Other Ambulatory Visit: Payer: Self-pay

## 2019-08-26 ENCOUNTER — Ambulatory Visit (INDEPENDENT_AMBULATORY_CARE_PROVIDER_SITE_OTHER): Payer: No Typology Code available for payment source | Admitting: Family Medicine

## 2019-08-26 VITALS — BP 142/90 | HR 73 | Ht 66.5 in | Wt 337.2 lb

## 2019-08-26 DIAGNOSIS — M25561 Pain in right knee: Secondary | ICD-10-CM

## 2019-08-26 DIAGNOSIS — M79641 Pain in right hand: Secondary | ICD-10-CM | POA: Diagnosis not present

## 2019-08-26 DIAGNOSIS — G8929 Other chronic pain: Secondary | ICD-10-CM

## 2019-08-26 DIAGNOSIS — R768 Other specified abnormal immunological findings in serum: Secondary | ICD-10-CM

## 2019-08-26 DIAGNOSIS — M25562 Pain in left knee: Secondary | ICD-10-CM

## 2019-08-26 DIAGNOSIS — R700 Elevated erythrocyte sedimentation rate: Secondary | ICD-10-CM

## 2019-08-26 DIAGNOSIS — M79642 Pain in left hand: Secondary | ICD-10-CM

## 2019-08-26 MED ORDER — PREDNISONE 5 MG (48) PO TBPK: ORAL_TABLET | ORAL | 0 refills | Status: DC

## 2019-08-26 MED ORDER — HYDROCODONE-ACETAMINOPHEN 5-325 MG PO TABS: 1.0000 | ORAL_TABLET | Freq: Four times a day (QID) | ORAL | 0 refills | Status: DC | PRN

## 2019-08-26 MED ORDER — PANTOPRAZOLE SODIUM 40 MG PO TBEC: 40.0000 mg | DELAYED_RELEASE_TABLET | Freq: Every day | ORAL | 1 refills | Status: AC

## 2019-08-26 NOTE — Patient Instructions (Signed)
Thank you for coming in today. Plan for prednisone for presumed rheum problem.  Use hydrocodone for signification pain,  Restart Protonix for stomach protection.  Let me know if this is not working.  Would consider fibromyalgia treatment like Cymbalta or lyrica.

## 2019-08-26 NOTE — Progress Notes (Addendum)
I, Christoper Fabian, LAT, ATC, am serving as scribe for Dr. Clementeen Graham.  Diane Haas is a 39 y.o. female who presents to [facility] today for f/u of chronic B knee pain, R>L.  She was last seen by Dr. Denyse Amass on 08/11/19 and had a R knee injection.  She has a hx of a prior R knee sx in 2011.   Also at the last visit she had a limited rheumatologic work-up with some significant for elevated ANA with titer 1-80 and very elevated sed rate at 95.  She was referred to rheumatology and had her first visit yesterday.  Rheumatologist ordered " a bunch of labs".  These are send out labs and expect to come back next week.  No treatment started yet. Since her last visit, pt reports that her B knees are about the same.  She notes that she felt worse after her R knee injection at her last visit.  She notes whole body pain and is uncomfortable and miserable.  Naproxen has not been helpful neither has tramadol in the past.  Diagnostic testing: R and L knee XR- 08/11/19   Pertinent review of systems: No fevers or chills  Relevant historical information: History gastric ulcer.  Orbit obesity.   Exam:  BP (!) 142/90 (BP Location: Right Arm, Patient Position: Sitting, Cuff Size: Large)   Pulse 73   Ht 5' 6.5" (1.689 m)   Wt (!) 337 lb 3.2 oz (153 kg)   SpO2 97%   BMI 53.61 kg/m  General: Well Developed, well nourished, and in no acute distress.   MSK: Mild diffuse tenderness multiple muscle groups across upper and lower extremities.    Lab and Radiology Results Recent Results (from the past 2160 hour(s))  CBC     Status: Abnormal   Collection Time: 08/03/19 10:34 AM  Result Value Ref Range   WBC 9.1 4.0 - 10.5 K/uL   RBC 4.92 3.87 - 5.11 Mil/uL   Platelets 316.0 150 - 400 K/uL   Hemoglobin 12.9 12.0 - 15.0 g/dL   HCT 41.6 36 - 46 %   MCV 80.7 78.0 - 100.0 fl   MCHC 32.5 30.0 - 36.0 g/dL   RDW 60.6 (H) 30.1 - 60.1 %  Comprehensive metabolic panel     Status:  None   Collection Time: 08/03/19 10:34 AM  Result Value Ref Range   Sodium 135 135 - 145 mEq/L   Potassium 4.6 3.5 - 5.1 mEq/L   Chloride 101 96 - 112 mEq/L   CO2 29 19 - 32 mEq/L   Glucose, Bld 99 70 - 99 mg/dL   BUN 14 6 - 23 mg/dL   Creatinine, Ser 0.93 0.40 - 1.20 mg/dL   Total Bilirubin 0.3 0.2 - 1.2 mg/dL   Alkaline Phosphatase 90 39 - 117 U/L   AST 16 0 - 37 U/L   ALT 22 0 - 35 U/L   Total Protein 7.2 6.0 - 8.3 g/dL   Albumin 4.0 3.5 - 5.2 g/dL   GFR 23.55 >73.22 mL/min   Calcium 9.0 8.4 - 10.5 mg/dL  Hemoglobin G2R     Status: None   Collection Time: 08/03/19 10:34 AM  Result Value Ref Range   Hgb A1c MFr Bld 6.5 4.6 - 6.5 %    Comment: Glycemic Control Guidelines for People with Diabetes:Non Diabetic:  <6%Goal of Therapy: <7%Additional Action Suggested:  >8%   Lipid panel     Status: Abnormal  Collection Time: 08/03/19 10:34 AM  Result Value Ref Range   Cholesterol 205 (H) 0 - 200 mg/dL    Comment: ATP III Classification       Desirable:  < 200 mg/dL               Borderline High:  200 - 239 mg/dL          High:  > = 270 mg/dL   Triglycerides 78.6 0 - 149 mg/dL    Comment: Normal:  <754 mg/dLBorderline High:  150 - 199 mg/dL   HDL 49.20 >10.07 mg/dL   VLDL 12.1 0.0 - 97.5 mg/dL   LDL Cholesterol 883 (H) 0 - 99 mg/dL   Total CHOL/HDL Ratio 5     Comment:                Men          Women1/2 Average Risk     3.4          3.3Average Risk          5.0          4.42X Average Risk          9.6          7.13X Average Risk          15.0          11.0                       NonHDL 164.26     Comment: NOTE:  Non-HDL goal should be 30 mg/dL higher than patient's LDL goal (i.e. LDL goal of < 70 mg/dL, would have non-HDL goal of < 100 mg/dL)  TSH     Status: None   Collection Time: 08/03/19 10:34 AM  Result Value Ref Range   TSH 1.61 0.35 - 4.50 uIU/mL  Urinalysis, Routine w reflex microscopic     Status: None   Collection Time: 08/03/19 10:34 AM  Result Value Ref Range    Color, Urine YELLOW Yellow;Lt. Yellow;Straw;Dark Yellow;Amber;Green;Red;Brown   APPearance CLEAR Clear;Turbid;Slightly Cloudy;Cloudy   Specific Gravity, Urine 1.025 1.000 - 1.030   pH 5.5 5.0 - 8.0   Total Protein, Urine NEGATIVE Negative   Urine Glucose NEGATIVE Negative   Ketones, ur NEGATIVE Negative   Bilirubin Urine NEGATIVE Negative   Hgb urine dipstick NEGATIVE Negative   Urobilinogen, UA 0.2 0.0 - 1.0   Leukocytes,Ua NEGATIVE Negative   Nitrite NEGATIVE Negative   WBC, UA 0-2/hpf 0-2/hpf   RBC / HPF none seen 0-2/hpf   Squamous Epithelial / LPF Rare(0-4/hpf) Rare(0-4/hpf)  Microalbumin / creatinine urine ratio     Status: None   Collection Time: 08/03/19 10:34 AM  Result Value Ref Range   Microalb, Ur 0.9 0.0 - 1.9 mg/dL   Creatinine,U 254.9 mg/dL   Microalb Creat Ratio 0.5 0.0 - 30.0 mg/g  VITAMIN D 25 Hydroxy (Vit-D Deficiency, Fractures)     Status: Abnormal   Collection Time: 08/03/19 10:34 AM  Result Value Ref Range   VITD <7.00 (L) 30.00 - 100.00 ng/mL  HIV Antibody (routine testing w rflx)     Status: None   Collection Time: 08/03/19 10:46 AM  Result Value Ref Range   HIV 1&2 Ab, 4th Generation NON-REACTIVE NON-REACTI    Comment: HIV-1 antigen and HIV-1/HIV-2 antibodies were not detected. There is no laboratory evidence of HIV infection. [name] PLEASE NOTE: This information has been disclosed to you from records whose  confidentiality may be protected by state law.  If your state requires such protection, then the state law prohibits you from making any further disclosure of the information without the specific written consent of the person to whom it pertains, or as otherwise permitted by law. A general authorization for the release of medical or other information is NOT sufficient for this purpose. . For additional information please refer to http://education.questdiagnostics.com/faq/FAQ106 (This link is being provided for informational/ educational purposes  only.) . [name]. The performance of this assay has not been clinically validated in patients less than 39 years old. .   Hepatitis C antibody     Status: None   Collection Time: 08/03/19 10:46 AM  Result Value Ref Range   Hepatitis C Ab NON-REACTIVE NON-REACTI   SIGNAL TO CUT-OFF 0.01 <1.00    Comment: . HCV antibody was non-reactive. There is no laboratory  evidence of HCV infection. . In most cases, no further action is required. However, if recent HCV exposure is suspected, a test for HCV RNA (test code 1610935645) is suggested. . For additional information please refer to http://education.questdiagn[facility].com/faq/FAQ22v1 (This link is being provided for informational/ educational purposes only.) .   Uric acid     Status: Abnormal   Collection Time: 08/11/19  9:27 AM  Result Value Ref Range   Uric Acid, Serum 7.6 (H) 2.4 - 7.0 mg/dL  CK     Status: None   Collection Time: 08/11/19  9:27 AM  Result Value Ref Range   Total CK 133 7.0 - 177.0 U/L  Cyclic citrul peptide antibody, IgG     Status: None   Collection Time: 08/11/19  9:27 AM  Result Value Ref Range   Cyclic Citrullin Peptide Ab <60<16 UNITS    Comment: Reference Range Negative:            <20 Weak Positive:       20-39 Moderate Positive:   40-59 Strong Positive:     >59 .   ANA     Status: Abnormal   Collection Time: 08/11/19  9:27 AM  Result Value Ref Range   Anti Nuclear Antibody (ANA) POSITIVE (A) NEGATIVE    Comment: ANA IFA is a first line screen for detecting the presence of up to approximately 150 autoantibodies in various autoimmune diseases. A positive ANA IFA result is suggestive of autoimmune disease and reflexes to titer and pattern. Further laboratory testing may be considered if clinically indicated. . For additional information, please refer to http://education.[facility].com/faq/FAQ177 (This link is being provided for informational/ educational purposes only.) .   Rheumatoid factor      Status: None   Collection Time: 08/11/19  9:27 AM  Result Value Ref Range   Rhuematoid fact SerPl-aCnc <14 <14 IU/mL  Sedimentation rate     Status: Abnormal   Collection Time: 08/11/19  9:27 AM  Result Value Ref Range   Sed Rate 95 (H) 0 - 20 mm/hr  Anti-nuclear ab-titer (ANA titer)     Status: Abnormal   Collection Time: 08/11/19  9:27 AM  Result Value Ref Range   ANA Titer 1 1:80 (H) titer    Comment: A low level ANA titer may be present in pre-clinical autoimmune diseases and normal individuals.                 Reference Range                 <1:40        Negative  1:40-1:80    Low Antibody Level                 >1:80        Elevated Antibody Level .    ANA Pattern 1 Cytoplasmic (A)     Comment: The presence of cytoplasmic fluorescence was noted on the HEp-2 slide. Other reactivities (e.g., anti- mitochondrial antibodies or anti-smooth muscle antibodies) may be responsible for this fluorescence. The clinical significance of this finding is uncertain. Clinical correlation is recommended. . AC-15 to AC-23: Cytoplasmic . International Consensus on ANA Patterns ([email or web address])        Assessment and Plan: 39 y.o. female with arthralgia and myalgia.  Concerning for rheumatologic process.  Patient had what sounds like an extended rheumatologic work-up with rheumatology yesterday.  No need on my end for further testing.  Given her not well controlled symptoms and diffuse pain reasonable to start trial of prednisone now.  We will do tapering 12-day course of prednisone.  Also will prescribe limited hydrocodone for better pain control.  I am hopeful that her rheumatology work-up is fruitful and she can start on more effective management in the near future.  We will also provide Protonix for GI prophylaxis while on prednisone given her history of stomach ulcer.  If not any better with prednisone may try treating for fibromyalgia with Lyrica or  Cymbalta.  Patient will let me know how she is feeling.  Work note provided today.  CC rheumatology    Addendum: Received medical records from [facility] orthopedics.  Most recent visit was 05/15/2018.  Patient had right knee lateral meniscus tear failing conservative management.  Ideally would they wanted to proceed with surgery however they were concerned about her weight.  Surgery was scheduled but then was canceled due to Covid.  PDMP reviewed during this encounter. No orders of the defined types were placed in this encounter.  Meds ordered this encounter  Medications  . predniSONE (STERAPRED UNI-PAK 48 TAB) 5 MG (48) TBPK tablet    Sig: 12 day dosepack po    Dispense:  48 tablet    Refill:  0  . HYDROcodone-acetaminophen (NORCO/VICODIN) 5-325 MG tablet    Sig: Take 1 tablet by mouth every 6 (six) hours as needed.    Dispense:  15 tablet    Refill:  0  . pantoprazole (PROTONIX) 40 MG tablet    Sig: Take 1 tablet (40 mg total) by mouth daily.    Dispense:  30 tablet    Refill:  1     Discussed warning signs or symptoms. Please see discharge instructions. Patient expresses understanding.   The above documentation has been reviewed and is accurate and complete Clementeen Graham, M.D.

## 2019-09-02 ENCOUNTER — Ambulatory Visit: Payer: No Typology Code available for payment source | Admitting: Family Medicine

## 2019-09-06 ENCOUNTER — Encounter: Payer: Self-pay | Admitting: Family Medicine

## 2019-09-06 ENCOUNTER — Ambulatory Visit (INDEPENDENT_AMBULATORY_CARE_PROVIDER_SITE_OTHER): Payer: No Typology Code available for payment source | Admitting: Family Medicine

## 2019-09-06 ENCOUNTER — Other Ambulatory Visit: Payer: Self-pay

## 2019-09-06 VITALS — BP 132/84 | HR 63 | Temp 97.4°F | Ht 66.0 in | Wt 334.0 lb

## 2019-09-06 DIAGNOSIS — R7303 Prediabetes: Secondary | ICD-10-CM

## 2019-09-06 DIAGNOSIS — F32A Depression, unspecified: Secondary | ICD-10-CM

## 2019-09-06 DIAGNOSIS — Z0000 Encounter for general adult medical examination without abnormal findings: Secondary | ICD-10-CM

## 2019-09-06 DIAGNOSIS — Z6841 Body Mass Index (BMI) 40.0 and over, adult: Secondary | ICD-10-CM

## 2019-09-06 DIAGNOSIS — I10 Essential (primary) hypertension: Secondary | ICD-10-CM

## 2019-09-06 DIAGNOSIS — E559 Vitamin D deficiency, unspecified: Secondary | ICD-10-CM

## 2019-09-06 DIAGNOSIS — F329 Major depressive disorder, single episode, unspecified: Secondary | ICD-10-CM | POA: Diagnosis not present

## 2019-09-06 NOTE — Progress Notes (Signed)
Established Patient Office Visit  Subjective:  Patient ID: Diane Haas, female    DOB: [birth year]  Age: 39 y.o. MRN: [id number]  CC:  Chief Complaint  Patient presents with  . Follow-up    1 month follow up on BP, patient would like referral to OBGYN     HPI Diane Haas presents for follow-up of hypertension and vitamin D deficiency.  Improved blood pressure control with Zestril.  Tolerating the medicine.  She is taking the high-dose vitamin D for super low vitamin D level.  Has not heard from GYN.  She is pursuing weight loss management.  Her insurance company is working on finding counseling provider in network for her.  Past Medical History:  Diagnosis Date  . Hypertension   . Stomach ulcer 1999  . Syncope, vasovagal 04/13/2015    Past Surgical History:  Procedure Laterality Date  . HERNIA REPAIR     Hernia removed from ovaray when younger.  [name] KNEE ARTHROSCOPY Right 02/25/2009    Family History  Problem Relation Age of Onset  . Diabetes Mother   . Hypertension Mother   . Hyperlipidemia Mother   . Gout Father   . Hypertension Father   . Cirrhosis Sister   . Depression Sister   . Breast cancer Maternal Grandmother     Social History   Socioeconomic History  . Marital status: Single    Spouse name: Not on file  . Number of children: Not on file  . Years of education: Not on file  . Highest education level: Not on file  Occupational History  . Not on file  Tobacco Use  . Smoking status: Former Smoker    Quit date: 02/22/2019    Years since quitting: 0.5  . Smokeless tobacco: Never Used  Substance and Sexual Activity  . Alcohol use: Yes    Comment: occ  . Drug use: Yes    Types: Marijuana    Comment: occ   . Sexual activity: Not on file  Other Topics Concern  . Not on file  Social History Narrative  . Not on file   Social Determinants of Health   Financial Resource Strain:   . Difficulty of Paying Living Expenses:   Food Insecurity:   .  Worried About Programme researcher, broadcasting/film/video in the Last Year:   . Barista in the Last Year:   Transportation Needs:   . Freight forwarder (Medical):   [name] Lack of Transportation (Non-Medical):   Physical Activity:   . Days of Exercise per Week:   . Minutes of Exercise per Session:   Stress:   . Feeling of Stress :   Social Connections:   . Frequency of Communication with Friends and Family:   . Frequency of Social Gatherings with Friends and Family:   . Attends Religious Services:   . Active Member of Clubs or Organizations:   . Attends Banker Meetings:   [name] Marital Status:   Intimate Partner Violence:   . Fear of Current or Ex-Partner:   . Emotionally Abused:   [name] Physically Abused:   . Sexually Abused:     Outpatient Medications Prior to Visit  Medication Sig Dispense Refill  . acetaminophen (TYLENOL) 500 MG tablet Take 1,000 mg by mouth as needed for moderate pain.    [name] diclofenac Sodium (VOLTAREN) 1 % GEL Apply 2 g topically 4 (four) times daily. 150 g 1  . HYDROcodone-acetaminophen (NORCO/VICODIN) 5-325 MG tablet Take 1 tablet  by mouth every 6 (six) hours as needed. 15 tablet 0  . ibuprofen (ADVIL) 200 MG tablet Take 800 mg by mouth as needed for moderate pain.    [name] lisinopril (ZESTRIL) 20 MG tablet Take 1 tablet (20 mg total) by mouth daily. 30 tablet 1  . pantoprazole (PROTONIX) 40 MG tablet Take 1 tablet (40 mg total) by mouth daily. 30 tablet 1  . predniSONE (STERAPRED UNI-PAK 48 TAB) 5 MG (48) TBPK tablet 12 day dosepack po 48 tablet 0  . Vitamin D, Ergocalciferol, (DRISDOL) 1.25 MG (50000 UNIT) CAPS capsule Take 1 capsule (50,000 Units total) by mouth every 7 (seven) days. 5 capsule 5  . ULTRAM 50 MG tablet Take 50 mg by mouth every 6 (six) hours.     No facility-administered medications prior to visit.    No Known Allergies  ROS Review of Systems  Constitutional: Negative.   HENT: Negative.   Eyes: Negative for photophobia and visual disturbance.    Respiratory: Negative.   Cardiovascular: Negative for chest pain and leg swelling.  Gastrointestinal: Negative.   Musculoskeletal: Positive for arthralgias.  Allergic/Immunologic: Negative for immunocompromised state.  Neurological: Negative for speech difficulty and light-headedness.  Psychiatric/Behavioral: Positive for dysphoric mood.      Objective:    Physical Exam Vitals and nursing note reviewed.  Constitutional:      General: She is not in acute distress.    Appearance: Normal appearance. She is not ill-appearing or toxic-appearing.  HENT:     Head: Normocephalic and atraumatic.     Right Ear: External ear normal.     Left Ear: External ear normal.  Eyes:     General:        Right eye: No discharge.        Left eye: No discharge.     Conjunctiva/sclera: Conjunctivae normal.  Cardiovascular:     Rate and Rhythm: Normal rate and regular rhythm.  Pulmonary:     Effort: Pulmonary effort is normal.     Breath sounds: Normal breath sounds.  Skin:    General: Skin is warm and dry.  Neurological:     Mental Status: She is alert and oriented to person, place, and time.  Psychiatric:        Mood and Affect: Mood normal.        Behavior: Behavior normal.     BP 132/84   Pulse 63   Temp (!) 97.4 F (36.3 C) (Tympanic)   Ht 5\' 6"  (1.676 m)   Wt (!) 334 lb (151.5 kg)   SpO2 98%   BMI 53.91 kg/m  Wt Readings from Last 3 Encounters:  09/06/19 (!) 334 lb (151.5 kg)  08/26/19 (!) 337 lb 3.2 oz (153 kg)  08/11/19 (!) 337 lb 12.8 oz (153.2 kg)     Health Maintenance Due  Topic Date Due  . COVID-19 Vaccine (1) Never done  . PAP SMEAR-Modifier  Never done    There are no preventive care reminders to display for this patient.  Lab Results  Component Value Date   TSH 1.61 08/03/2019   Lab Results  Component Value Date   WBC 9.1 08/03/2019   HGB 12.9 08/03/2019   HCT 39.7 08/03/2019   MCV 80.7 08/03/2019   PLT 316.0 08/03/2019   Lab Results  Component  Value Date   NA 135 08/03/2019   K 4.6 08/03/2019   CO2 29 08/03/2019   GLUCOSE 99 08/03/2019   BUN 14 08/03/2019   CREATININE 0.79  08/03/2019   BILITOT 0.3 08/03/2019   ALKPHOS 90 08/03/2019   AST 16 08/03/2019   ALT 22 08/03/2019   PROT 7.2 08/03/2019   ALBUMIN 4.0 08/03/2019   CALCIUM 9.0 08/03/2019   ANIONGAP 11 05/12/2019   GFR 97.99 08/03/2019   Lab Results  Component Value Date   CHOL 205 (H) 08/03/2019   Lab Results  Component Value Date   HDL 40.90 08/03/2019   Lab Results  Component Value Date   LDLCALC 147 (H) 08/03/2019   Lab Results  Component Value Date   TRIG 86.0 08/03/2019   Lab Results  Component Value Date   CHOLHDL 5 08/03/2019   Lab Results  Component Value Date   HGBA1C 6.5 08/03/2019      Assessment & Plan:   Problem List Items Addressed This Visit      Cardiovascular and Mediastinum   Essential hypertension - Primary     Other   Healthcare maintenance   Relevant Orders   Ambulatory referral to Gynecology   Depression   Morbid obesity with BMI of 50.0-59.9, adult (HCC)   Prediabetes   Vitamin D deficiency      No orders of the defined types were placed in this encounter.   Follow-up: Return in about 3 months (around 12/07/2019).   Patient believes she can lose some weight over the next 3 months.  Aware the importance of weight loss regarding her current medical problems.  She is understandably frustrated with her current state of health and I encouraged her to hang in there and keep added.  We will follow-up with counseling for talking therapy. Mliss Sax, MD

## 2019-09-10 ENCOUNTER — Encounter: Payer: Self-pay | Admitting: Family Medicine

## 2019-09-10 ENCOUNTER — Other Ambulatory Visit: Payer: Self-pay

## 2019-09-10 ENCOUNTER — Ambulatory Visit (INDEPENDENT_AMBULATORY_CARE_PROVIDER_SITE_OTHER): Payer: No Typology Code available for payment source | Admitting: Family Medicine

## 2019-09-10 VITALS — BP 148/98 | HR 66 | Ht 66.0 in | Wt 337.0 lb

## 2019-09-10 DIAGNOSIS — R768 Other specified abnormal immunological findings in serum: Secondary | ICD-10-CM | POA: Diagnosis not present

## 2019-09-10 DIAGNOSIS — R700 Elevated erythrocyte sedimentation rate: Secondary | ICD-10-CM | POA: Diagnosis not present

## 2019-09-10 DIAGNOSIS — M791 Myalgia, unspecified site: Secondary | ICD-10-CM | POA: Diagnosis not present

## 2019-09-10 MED ORDER — HYDROCODONE-ACETAMINOPHEN 5-325 MG PO TABS: 1.0000 | ORAL_TABLET | Freq: Four times a day (QID) | ORAL | 0 refills | Status: DC | PRN

## 2019-09-10 MED ORDER — PREGABALIN 75 MG PO CAPS: 75.0000 mg | ORAL_CAPSULE | Freq: Two times a day (BID) | ORAL | 3 refills | Status: DC

## 2019-09-10 NOTE — Progress Notes (Signed)
   I, Ronelle Nigh, LAT, ATC, am serving as scribe for Dr. Clementeen Graham.  Diane Haas is a 39 y.o. female who presents to [facility] today for f/u of B knee, R>L, and hand pain.  She was last seen by Dr. Denyse Amass on 08/26/19 and noted that her R knee worsened after having an injection on 08/11/19.  She has seen rheumatology.  She has been prescribed prednisone, hydrocodone-acetaminophen and protonix.  Since her last visit, pt reports that she is still in a lot of pain. States she feels about the same.  She has diffuse myalgias throughout her entire body.  Patient had extensive work-up with rheumatology and has a follow-up appointment to discuss results on July 22.  Diagnostic testing: B knee XR, R hand XR- 08/11/19   Pertinent review of systems: No fevers or chills  Relevant historical information: Hypertension   Exam:  BP (!) 148/98 (BP Location: Left Arm, Patient Position: Sitting)   Pulse 66   Ht 5\' 6"  (1.676 m)   Wt (!) 337 lb (152.9 kg)   SpO2 98%   BMI 54.39 kg/m  General: Well Developed, well nourished, and in no acute distress.   MSK: Tender palpation muscle groups upper and lower extremities.       Assessment and Plan: 39 y.o. female with diffuse myalgias.  Possibly rheumatologic condition however based on what she reports is her lab results may be less likely.  She has follow-up with her rheumatologist next week to discuss the results which should be helpful.  In the meantime this may be fibromyalgia.  We will treat as such with trial of Lyrica.  If this is not sufficient we will consider Cymbalta or even nortriptyline or amitriptyline.  Limited hydrocodone refill today.   PDMP reviewed during this encounter. No orders of the defined types were placed in this encounter.  Meds ordered this encounter  Medications  . pregabalin (LYRICA) 75 MG capsule    Sig: Take 1 capsule (75 mg total) by mouth 2 (two) times daily.    Dispense:  60  capsule    Refill:  3  . HYDROcodone-acetaminophen (NORCO/VICODIN) 5-325 MG tablet    Sig: Take 1 tablet by mouth every 6 (six) hours as needed.    Dispense:  15 tablet    Refill:  0     Discussed warning signs or symptoms. Please see discharge instructions. Patient expresses understanding.   The above documentation has been reviewed and is accurate and complete 24, M.D.

## 2019-09-10 NOTE — Patient Instructions (Signed)
Thank you for coming in today. Let me know what the rheumatologist says on your visit on the 22nd.   I think this may be fibromyalgia.  Start lyrica twice daily.  If this does not help or is annoying let me know.  Next medicine to try is Cymbalta and or Nortriptyline.    Myofascial Pain Syndrome and Fibromyalgia Myofascial pain syndrome and fibromyalgia are both pain disorders. This pain may be felt mainly in your muscles.  Myofascial pain syndrome: ? Always has tender points in the muscle that will cause pain when pressed (trigger points). The pain may come and go. ? Usually affects your neck, upper back, and shoulder areas. The pain often radiates into your arms and hands.  Fibromyalgia: ? Has muscle pains and tenderness that come and go. ? Is often associated with fatigue and sleep problems. ? Has trigger points. ? Tends to be long-lasting (chronic), but is not life-threatening. Fibromyalgia and myofascial pain syndrome are not the same. However, they often occur together. If you have both conditions, each can make the other worse. Both are common and can cause enough pain and fatigue to make day-to-day activities difficult. Both can be hard to diagnose because their symptoms are common in many other conditions. What are the causes? The exact causes of these conditions are not known. What increases the risk? You are more likely to develop this condition if:  You have a family history of the condition.  You have certain triggers, such as: ? Spine disorders. ? An injury (trauma) or other physical stressors. ? Being under a lot of stress. ? Medical conditions such as osteoarthritis, rheumatoid arthritis, or lupus. What are the signs or symptoms? Fibromyalgia The main symptom of fibromyalgia is widespread pain and tenderness in your muscles. Pain is sometimes described as stabbing, shooting, or burning. You may also have:  Tingling or numbness.  Sleep problems and  fatigue.  Problems with attention and concentration (fibro fog). Other symptoms may include:  Bowel and bladder problems.  Headaches.  Visual problems.  Problems with odors and noises.  Depression or mood changes.  Painful menstrual periods (dysmenorrhea).  Dry skin or eyes. These symptoms can vary over time. Myofascial pain syndrome Symptoms of myofascial pain syndrome include:  Tight, ropy bands of muscle.  Uncomfortable sensations in muscle areas. These may include aching, cramping, burning, numbness, tingling, and weakness.  Difficulty moving certain parts of the body freely (poor range of motion). How is this diagnosed? This condition may be diagnosed by your symptoms and medical history. You will also have a physical exam. In general:  Fibromyalgia is diagnosed if you have pain, fatigue, and other symptoms for more than 3 months, and symptoms cannot be explained by another condition.  Myofascial pain syndrome is diagnosed if you have trigger points in your muscles, and those trigger points are tender and cause pain elsewhere in your body (referred pain). How is this treated? Treatment for these conditions depends on the type that you have.  For fibromyalgia: ? Pain medicines, such as NSAIDs. ? Medicines for treating depression. ? Medicines for treating seizures. ? Medicines that relax the muscles.  For myofascial pain: ? Pain medicines, such as NSAIDs. ? Cooling and stretching of muscles. ? Trigger point injections. ? Sound wave (ultrasound) treatments to stimulate muscles. Treating these conditions often requires a team of health care providers. These may include:  Your primary care provider.  Physical therapist.  Complementary health care providers, such as massage therapists or acupuncturists.  Psychiatrist for cognitive behavioral therapy. Follow these instructions at home: Medicines  Take over-the-counter and prescription medicines only as told  by your health care provider.  Do not drive or use heavy machinery while taking prescription pain medicine.  If you are taking prescription pain medicine, take actions to prevent or treat constipation. Your health care provider may recommend that you: ? Drink enough fluid to keep your urine pale yellow. ? Eat foods that are high in fiber, such as fresh fruits and vegetables, whole grains, and beans. ? Limit foods that are high in fat and processed sugars, such as fried or sweet foods. ? Take an over-the-counter or prescription medicine for constipation. Lifestyle   Exercise as directed by your health care provider or physical therapist.  Practice relaxation techniques to control your stress. You may want to try: ? Biofeedback. ? Visual imagery. ? Hypnosis. ? Muscle relaxation. ? Yoga. ? Meditation.  Maintain a healthy lifestyle. This includes eating a healthy diet and getting enough sleep.  Do not use any products that contain nicotine or tobacco, such as cigarettes and e-cigarettes. If you need help quitting, ask your health care provider. General instructions  Talk to your health care provider about complementary treatments, such as acupuncture or massage.  Consider joining a support group with others who are diagnosed with this condition.  Do not do activities that stress or strain your muscles. This includes repetitive motions and heavy lifting.  Keep all follow-up visits as told by your health care provider. This is important. Where to find more information  National Fibromyalgia Association: www.fmaware.org  Arthritis Foundation: www.arthritis.org  American Chronic Pain Association: www.theacpa.org Contact a health care provider if:  You have new symptoms.  Your symptoms get worse or your pain is severe.  You have side effects from your medicines.  You have trouble sleeping.  Your condition is causing depression or anxiety. Summary  Myofascial pain syndrome  and fibromyalgia are pain disorders.  Myofascial pain syndrome has tender points in the muscle that will cause pain when pressed (trigger points). Fibromyalgia also has muscle pains and tenderness that come and go, but this condition is often associated with fatigue and sleep disturbances.  Fibromyalgia and myofascial pain syndrome are not the same but often occur together, causing pain and fatigue that make day-to-day activities difficult.  Treatment for fibromyalgia includes taking medicines to relax the muscles and medicines for pain, depression, or seizures. Treatment for myofascial pain syndrome includes taking medicines for pain, cooling and stretching of muscles, and injecting medicines into trigger points.  Follow your health care provider's instructions for taking medicines and maintaining a healthy lifestyle. This information is not intended to replace advice given to you by your health care provider. Make sure you discuss any questions you have with your health care provider. Document Revised: 06/05/2018 Document Reviewed: 02/26/2017 Elsevier Patient Education  2020 [employer].

## 2019-09-14 ENCOUNTER — Encounter (INDEPENDENT_AMBULATORY_CARE_PROVIDER_SITE_OTHER): Payer: Self-pay

## 2019-09-15 ENCOUNTER — Encounter (INDEPENDENT_AMBULATORY_CARE_PROVIDER_SITE_OTHER): Payer: Self-pay

## 2019-09-15 NOTE — Telephone Encounter (Signed)
Please advise 

## 2019-10-11 ENCOUNTER — Other Ambulatory Visit: Payer: Self-pay | Admitting: Family Medicine

## 2019-10-11 ENCOUNTER — Telehealth: Payer: Self-pay | Admitting: Family Medicine

## 2019-10-11 NOTE — Telephone Encounter (Signed)
Pt calling for refill of hydrocodone to [facility] on [address]. Pt informed that provider is out of the office today, 8/16.

## 2019-10-12 MED ORDER — HYDROCODONE-ACETAMINOPHEN 5-325 MG PO TABS: 1.0000 | ORAL_TABLET | Freq: Four times a day (QID) | ORAL | 0 refills | Status: DC | PRN

## 2019-10-12 NOTE — Telephone Encounter (Signed)
Medication refilled

## 2019-10-27 ENCOUNTER — Encounter: Payer: Self-pay | Admitting: Family Medicine

## 2019-10-27 DIAGNOSIS — M25562 Pain in left knee: Secondary | ICD-10-CM

## 2019-10-27 DIAGNOSIS — M791 Myalgia, unspecified site: Secondary | ICD-10-CM

## 2019-10-27 DIAGNOSIS — M79641 Pain in right hand: Secondary | ICD-10-CM

## 2019-11-02 ENCOUNTER — Encounter: Payer: Self-pay | Admitting: Family Medicine

## 2019-11-16 ENCOUNTER — Other Ambulatory Visit: Payer: Self-pay | Admitting: Family Medicine

## 2019-11-16 NOTE — Telephone Encounter (Signed)
Patient called regarding this medication refill. She said that she is going out of town and will be leaving tomorrow so she wanted to make sure that she had her medication with her.  Please advise.

## 2019-11-17 MED ORDER — HYDROCODONE-ACETAMINOPHEN 5-325 MG PO TABS: 1.0000 | ORAL_TABLET | Freq: Four times a day (QID) | ORAL | 0 refills | Status: DC | PRN

## 2019-11-29 ENCOUNTER — Encounter: Payer: Self-pay | Admitting: Family Medicine

## 2019-12-08 ENCOUNTER — Other Ambulatory Visit: Payer: Self-pay

## 2019-12-09 ENCOUNTER — Encounter: Payer: Self-pay | Admitting: Family Medicine

## 2019-12-09 ENCOUNTER — Telehealth (INDEPENDENT_AMBULATORY_CARE_PROVIDER_SITE_OTHER): Payer: No Typology Code available for payment source | Admitting: Family Medicine

## 2019-12-09 ENCOUNTER — Other Ambulatory Visit: Payer: No Typology Code available for payment source

## 2019-12-09 VITALS — Ht 66.0 in

## 2019-12-09 DIAGNOSIS — K2900 Acute gastritis without bleeding: Secondary | ICD-10-CM

## 2019-12-09 DIAGNOSIS — I10 Essential (primary) hypertension: Secondary | ICD-10-CM

## 2019-12-09 DIAGNOSIS — R7303 Prediabetes: Secondary | ICD-10-CM

## 2019-12-09 DIAGNOSIS — G44209 Tension-type headache, unspecified, not intractable: Secondary | ICD-10-CM | POA: Diagnosis not present

## 2019-12-09 DIAGNOSIS — K2970 Gastritis, unspecified, without bleeding: Secondary | ICD-10-CM | POA: Insufficient documentation

## 2019-12-09 LAB — COMPREHENSIVE METABOLIC PANEL
ALT: 20 U/L (ref 0–35)
AST: 21 U/L (ref 0–37)
Albumin: 4.1 g/dL (ref 3.5–5.2)
Alkaline Phosphatase: 79 U/L (ref 39–117)
BUN: 14 mg/dL (ref 6–23)
CO2: 27 mEq/L (ref 19–32)
Calcium: 9.4 mg/dL (ref 8.4–10.5)
Chloride: 99 mEq/L (ref 96–112)
Creatinine, Ser: 0.88 mg/dL (ref 0.40–1.20)
GFR: 82.51 mL/min (ref >60.00)
Glucose, Bld: 90 mg/dL (ref 70–99)
Potassium: 4.5 mEq/L (ref 3.5–5.1)
Sodium: 135 mEq/L (ref 135–145)
Total Bilirubin: 0.4 mg/dL (ref 0.2–1.2)
Total Protein: 8.2 g/dL (ref 6.0–8.3)

## 2019-12-09 MED ORDER — ONDANSETRON HCL 4 MG PO TABS: 4.0000 mg | ORAL_TABLET | Freq: Three times a day (TID) | ORAL | 0 refills | Status: AC | PRN

## 2019-12-09 NOTE — Progress Notes (Signed)
Established Patient Office Visit  Subjective:  Patient ID: Diane Haas, female    DOB: [birth year]  Age: 39 y.o. MRN: [id number]  CC:  Chief Complaint  Patient presents with  . Follow-up    3 month follow up on BP patient states that possibly starting on medications for diabetes. Started with vomiting this morning. Bad cramps stomach and both sides.    HPI Diane Haas presents for evaluation of a 1 to 2-day history of nausea and vomiting of clear fluid.  There is been a generalized headache all around her head.  She has had no fevers chills, rash or neck stiffness.  She had loose stools 2 days ago.  There has been no stuffy nose drainage cough or shortness of breath.  No loss of taste or smell.  Continues to take the Zestril for her high blood pressure.  Has been unable to check her blood pressures recently.  She has an appointment scheduled with pain management.  She is under rheumatological work-up per Dr. Logan Bores.   Past Medical History:  Diagnosis Date  . Hypertension   . Stomach ulcer 1999  . Syncope, vasovagal 04/13/2015    Past Surgical History:  Procedure Laterality Date  . HERNIA REPAIR     Hernia removed from ovaray when younger.  [name] KNEE ARTHROSCOPY Right 02/25/2009    Family History  Problem Relation Age of Onset  . Diabetes Mother   . Hypertension Mother   . Hyperlipidemia Mother   . Gout Father   . Hypertension Father   . Cirrhosis Sister   . Depression Sister   . Breast cancer Maternal Grandmother     Social History   Socioeconomic History  . Marital status: Single    Spouse name: Not on file  . Number of children: Not on file  . Years of education: Not on file  . Highest education level: Not on file  Occupational History  . Not on file  Tobacco Use  . Smoking status: Former Smoker    Quit date: 02/22/2019    Years since quitting: 0.7  . Smokeless tobacco: Never Used  Substance and Sexual Activity  . Alcohol use: Yes    Comment: occ  .  Drug use: Yes    Types: Marijuana    Comment: occ   . Sexual activity: Not on file  Other Topics Concern  . Not on file  Social History Narrative  . Not on file   Social Determinants of Health   Financial Resource Strain:   . Difficulty of Paying Living Expenses: Not on file  Food Insecurity:   . Worried About Programme researcher, broadcasting/film/video in the Last Year: Not on file  . Ran Out of Food in the Last Year: Not on file  Transportation Needs:   . Lack of Transportation (Medical): Not on file  . Lack of Transportation (Non-Medical): Not on file  Physical Activity:   . Days of Exercise per Week: Not on file  . Minutes of Exercise per Session: Not on file  Stress:   . Feeling of Stress : Not on file  Social Connections:   . Frequency of Communication with Friends and Family: Not on file  . Frequency of Social Gatherings with Friends and Family: Not on file  . Attends Religious Services: Not on file  . Active Member of Clubs or Organizations: Not on file  . Attends Banker Meetings: Not on file  . Marital Status: Not on file  Intimate Partner  Violence:   . Fear of Current or Ex-Partner: Not on file  . Emotionally Abused: Not on file  . Physically Abused: Not on file  . Sexually Abused: Not on file    Outpatient Medications Prior to Visit  Medication Sig Dispense Refill  . diclofenac Sodium (VOLTAREN) 1 % GEL Apply 2 g topically 4 (four) times daily. 150 g 1  . HYDROcodone-acetaminophen (NORCO/VICODIN) 5-325 MG tablet Take 1 tablet by mouth every 6 (six) hours as needed. 15 tablet 0  . ibuprofen (ADVIL) 200 MG tablet Take 800 mg by mouth as needed for moderate pain.    [name] lisinopril (ZESTRIL) 20 MG tablet Take 1 tablet (20 mg total) by mouth daily. 30 tablet 1  . pantoprazole (PROTONIX) 40 MG tablet Take 1 tablet (40 mg total) by mouth daily. 30 tablet 1  . Vitamin D, Ergocalciferol, (DRISDOL) 1.25 MG (50000 UNIT) CAPS capsule Take 1 capsule (50,000 Units total) by mouth every  7 (seven) days. 5 capsule 5  . acetaminophen (TYLENOL) 500 MG tablet Take 1,000 mg by mouth as needed for moderate pain. (Patient not taking: Reported on 12/09/2019)    . pregabalin (LYRICA) 75 MG capsule Take 1 capsule (75 mg total) by mouth 2 (two) times daily. (Patient not taking: Reported on 12/09/2019) 60 capsule 3  . ULTRAM 50 MG tablet Take 50 mg by mouth every 6 (six) hours. (Patient not taking: Reported on 12/09/2019)     No facility-administered medications prior to visit.    No Known Allergies  ROS Review of Systems  Constitutional: Negative for chills, diaphoresis, fatigue, fever and unexpected weight change.  HENT: Negative.  Negative for congestion, postnasal drip, rhinorrhea and sore throat.   Eyes: Negative for photophobia.  Respiratory: Negative for cough and shortness of breath.   Cardiovascular: Negative.   Gastrointestinal: Positive for nausea and vomiting. Negative for abdominal pain.  Endocrine: Negative for polyphagia and polyuria.  Genitourinary: Negative.   Skin: Negative for rash.  Allergic/Immunologic: Negative for immunocompromised state.  Neurological: Positive for headaches. Negative for speech difficulty.  Hematological: Does not bruise/bleed easily.      Objective:    Physical Exam Vitals and nursing note reviewed.  Constitutional:      General: She is not in acute distress.    Appearance: Normal appearance. She is not ill-appearing, toxic-appearing or diaphoretic.  HENT:     Right Ear: External ear normal.     Left Ear: External ear normal.  Neurological:     Mental Status: She is alert.     Ht 5\' 6"  (1.676 m)   BMI 54.39 kg/m  Wt Readings from Last 3 Encounters:  09/10/19 (!) 337 lb (152.9 kg)  09/06/19 (!) 334 lb (151.5 kg)  08/26/19 (!) 337 lb 3.2 oz (153 kg)     Health Maintenance Due  Topic Date Due  . COVID-19 Vaccine (1) Never done  . PAP SMEAR-Modifier  Never done  . INFLUENZA VACCINE  09/26/2019    There are no  preventive care reminders to display for this patient.  Lab Results  Component Value Date   TSH 1.61 08/03/2019   Lab Results  Component Value Date   WBC 9.1 08/03/2019   HGB 12.9 08/03/2019   HCT 39.7 08/03/2019   MCV 80.7 08/03/2019   PLT 316.0 08/03/2019   Lab Results  Component Value Date   NA 135 08/03/2019   K 4.6 08/03/2019   CO2 29 08/03/2019   GLUCOSE 99 08/03/2019   BUN 14  08/03/2019   CREATININE 0.79 08/03/2019   BILITOT 0.3 08/03/2019   ALKPHOS 90 08/03/2019   AST 16 08/03/2019   ALT 22 08/03/2019   PROT 7.2 08/03/2019   ALBUMIN 4.0 08/03/2019   CALCIUM 9.0 08/03/2019   ANIONGAP 11 05/12/2019   GFR 97.99 08/03/2019   Lab Results  Component Value Date   CHOL 205 (H) 08/03/2019   Lab Results  Component Value Date   HDL 40.90 08/03/2019   Lab Results  Component Value Date   LDLCALC 147 (H) 08/03/2019   Lab Results  Component Value Date   TRIG 86.0 08/03/2019   Lab Results  Component Value Date   CHOLHDL 5 08/03/2019   Lab Results  Component Value Date   HGBA1C 6.5 08/03/2019      Assessment & Plan:   Problem List Items Addressed This Visit      Cardiovascular and Mediastinum   Essential hypertension   Relevant Orders   CBC   Comprehensive metabolic panel     Digestive   Gastritis   Relevant Medications   ondansetron (ZOFRAN) 4 MG tablet     Other   Prediabetes - Primary   Relevant Orders   Comprehensive metabolic panel   Hemoglobin A1c   Acute non intractable tension-type headache      Meds ordered this encounter  Medications  . ondansetron (ZOFRAN) 4 MG tablet    Sig: Take 1 tablet (4 mg total) by mouth every 8 (eight) hours as needed for nausea or vomiting.    Dispense:  20 tablet    Refill:  0    Follow-up: Return in about 2 weeks (around 12/23/2019).    Mliss Sax, MD   Virtual Visit via Video Note  I connected with Diane Haas on 12/09/19 at  9:30 AM EDT by a video enabled telemedicine  application and verified that I am speaking with the correct person using two identifiers.  Location: Patient: alone outside.  Provider:    I discussed the limitations of evaluation and management by telemedicine and the availability of in person appointments. The patient expressed understanding and agreed to proceed.  History of Present Illness:    Observations/Objective:   Assessment and Plan:   Follow Up Instructions:    I discussed the assessment and treatment plan with the patient. The patient was provided an opportunity to ask questions and all were answered. The patient agreed with the plan and demonstrated an understanding of the instructions.   The patient was advised to call back or seek an in-person evaluation if the symptoms worsen or if the condition fails to improve as anticipated.  I provided 25 minutes of non-face-to-face time during this encounter.   Mliss Sax, MD

## 2019-12-15 ENCOUNTER — Other Ambulatory Visit: Payer: Self-pay

## 2019-12-16 ENCOUNTER — Other Ambulatory Visit (INDEPENDENT_AMBULATORY_CARE_PROVIDER_SITE_OTHER): Payer: No Typology Code available for payment source

## 2019-12-16 ENCOUNTER — Telehealth: Payer: Self-pay | Admitting: Family Medicine

## 2019-12-16 ENCOUNTER — Encounter: Payer: Self-pay | Admitting: Family Medicine

## 2019-12-16 DIAGNOSIS — I10 Essential (primary) hypertension: Secondary | ICD-10-CM | POA: Diagnosis not present

## 2019-12-16 DIAGNOSIS — R7303 Prediabetes: Secondary | ICD-10-CM | POA: Diagnosis not present

## 2019-12-16 LAB — CBC
HCT: 37.4 % (ref 36.0–46.0)
Hemoglobin: 12 g/dL (ref 12.0–15.0)
MCHC: 32.1 g/dL (ref 30.0–36.0)
MCV: 81.4 fl (ref 78.0–100.0)
Platelets: 283 10*3/uL (ref 150.0–400.0)
RBC: 4.59 Mil/uL (ref 3.87–5.11)
RDW: 14.9 % (ref 11.5–15.5)
WBC: 8.1 10*3/uL (ref 4.0–10.5)

## 2019-12-16 LAB — HEMOGLOBIN A1C: Hgb A1c MFr Bld: 6.3 % (ref 4.6–6.5)

## 2019-12-16 NOTE — Telephone Encounter (Signed)
Patient is calling back and wanted to see if she can get a doctors note for coming in the office today, please advise. CB is [phone]

## 2019-12-16 NOTE — Telephone Encounter (Signed)
Note written

## 2019-12-31 ENCOUNTER — Other Ambulatory Visit: Payer: Self-pay | Admitting: Family Medicine

## 2019-12-31 MED ORDER — HYDROCODONE-ACETAMINOPHEN 5-325 MG PO TABS
1.0000 | ORAL_TABLET | Freq: Four times a day (QID) | ORAL | 0 refills | Status: DC | PRN
Start: 2019-12-31 — End: 2020-01-14

## 2019-12-31 NOTE — Telephone Encounter (Signed)
Please advise 

## 2020-01-07 ENCOUNTER — Encounter: Payer: Self-pay | Admitting: Family Medicine

## 2020-01-07 ENCOUNTER — Other Ambulatory Visit: Payer: Self-pay | Admitting: Family

## 2020-01-07 DIAGNOSIS — I10 Essential (primary) hypertension: Secondary | ICD-10-CM

## 2020-01-12 ENCOUNTER — Telehealth: Payer: Self-pay | Admitting: Family Medicine

## 2020-01-12 NOTE — Telephone Encounter (Signed)
Received notes from Rheum Dr Kathi Ludwig.   Dr Kathi Ludwig does not think she has a autoimmune or rheumatologic problem. Note will be sent to scan.

## 2020-01-13 ENCOUNTER — Telehealth: Payer: Self-pay

## 2020-01-13 MED ORDER — LISINOPRIL 20 MG PO TABS: 20.0000 mg | ORAL_TABLET | Freq: Every day | ORAL | 0 refills | Status: AC

## 2020-01-13 NOTE — Addendum Note (Signed)
Addended by: Worthy Rancher B on: 01/13/2020 10:04 PM   Modules accepted: Orders

## 2020-01-13 NOTE — Telephone Encounter (Signed)
Patient called stating that she was talking to Dr. Denyse Amass because she was told her hydrocodone would be more than 15 tablets so when she moves she would be good till she get another provider where she moves to. Please call patient to discuss.

## 2020-01-14 MED ORDER — PREGABALIN 75 MG PO CAPS: 75.0000 mg | ORAL_CAPSULE | Freq: Two times a day (BID) | ORAL | 3 refills | Status: AC

## 2020-01-14 MED ORDER — HYDROCODONE-ACETAMINOPHEN 5-325 MG PO TABS
1.0000 | ORAL_TABLET | Freq: Two times a day (BID) | ORAL | 0 refills | Status: AC | PRN
Start: 2020-01-14 — End: ?

## 2020-01-14 NOTE — Telephone Encounter (Signed)
I can only prescribe a few tablets at a time based on [place] laws.  Effectively I does need to know when and where you are moving so that we can make that transition happen.

## 2020-01-14 NOTE — Telephone Encounter (Signed)
I spoke with Diane Haas. She has been taking Norco 4x daily.  Discussed that this is more than I can prescribe on a longer-term basis.  Plan to try twice daily and provided enough to last 1 month.  Patient will be leaving to move to [place] tomorrow.  She is in the process of establishing care down there.  Additionally she needs refill of pregabalin.  Will prescribe 1 month hydrocodone 3 months pregabalin.  Keep me updated recheck back as needed.  PDMP reviewed during this encounter.

## 2020-01-14 NOTE — Telephone Encounter (Signed)
Called and left detailed message on pt's VM.  Relayed Dr. Zollie Pee response regarding only being able to do prescriptions for limited quantities of meds like hydrocodone.  Also ask pt to let us know how many tablets she currently has and where she would like a refill sent.  He also wants to know when she is planning on moving.  We can send a refill to her new pharmacy if she is moving before she runs out of current rx.

## 2020-08-01 ENCOUNTER — Other Ambulatory Visit: Payer: Self-pay | Admitting: Family Medicine

## 2020-08-01 DIAGNOSIS — E559 Vitamin D deficiency, unspecified: Secondary | ICD-10-CM

## 2021-02-05 IMAGING — DX DG KNEE AP/LAT W/ SUNRISE*L*
3 series · 3 of 3 positions shown · non-contrast
Comparison: None.

CLINICAL DATA: Chronic bilateral knee pain. Hx of Meniscus rt.
repair.

EXAM:
LEFT KNEE 3 VIEWS

[knee ap]
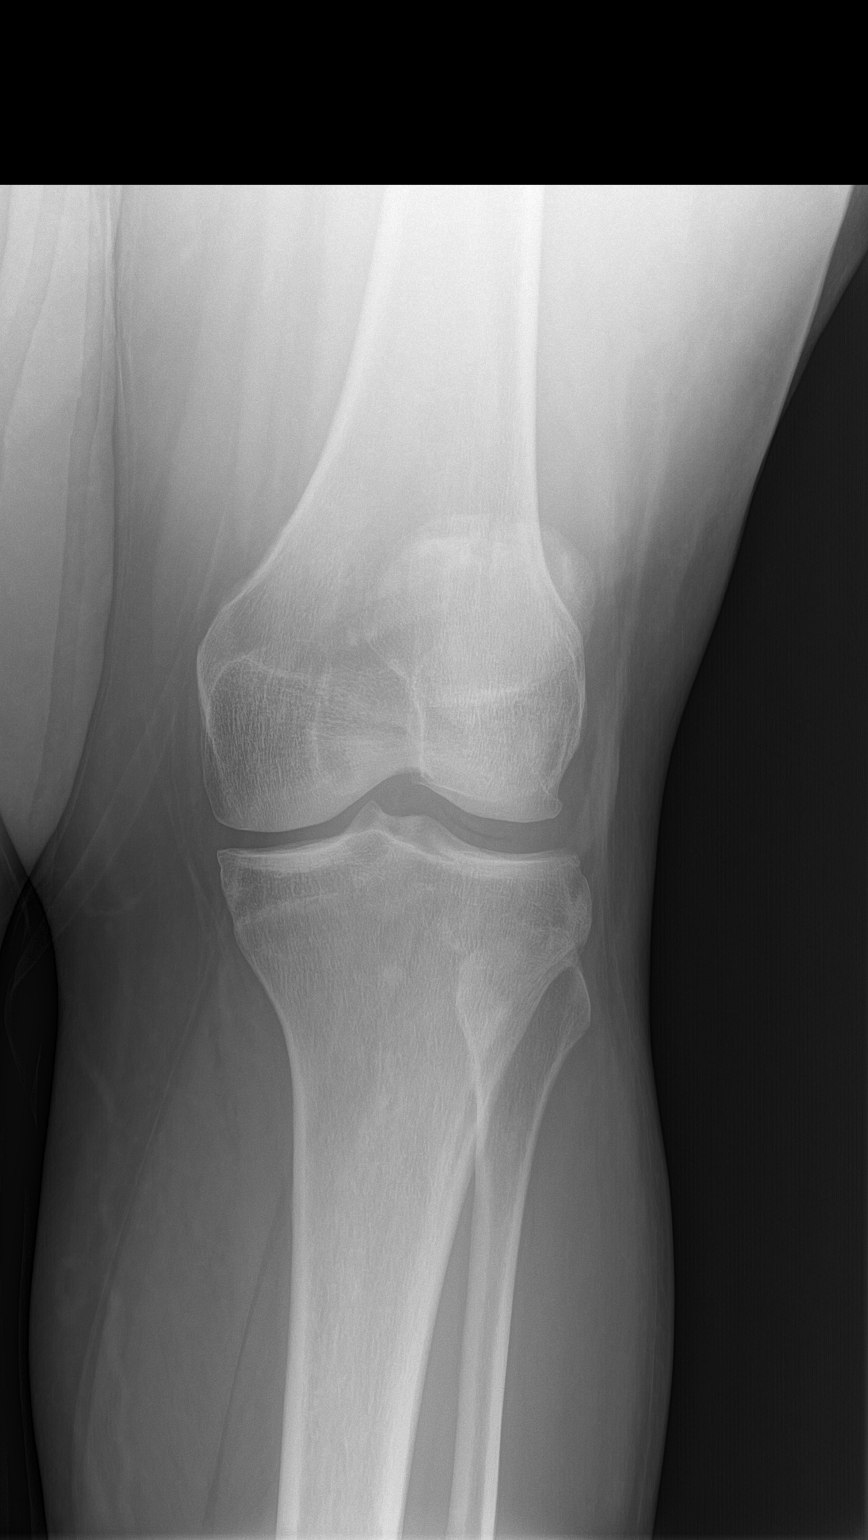

[knee lat]
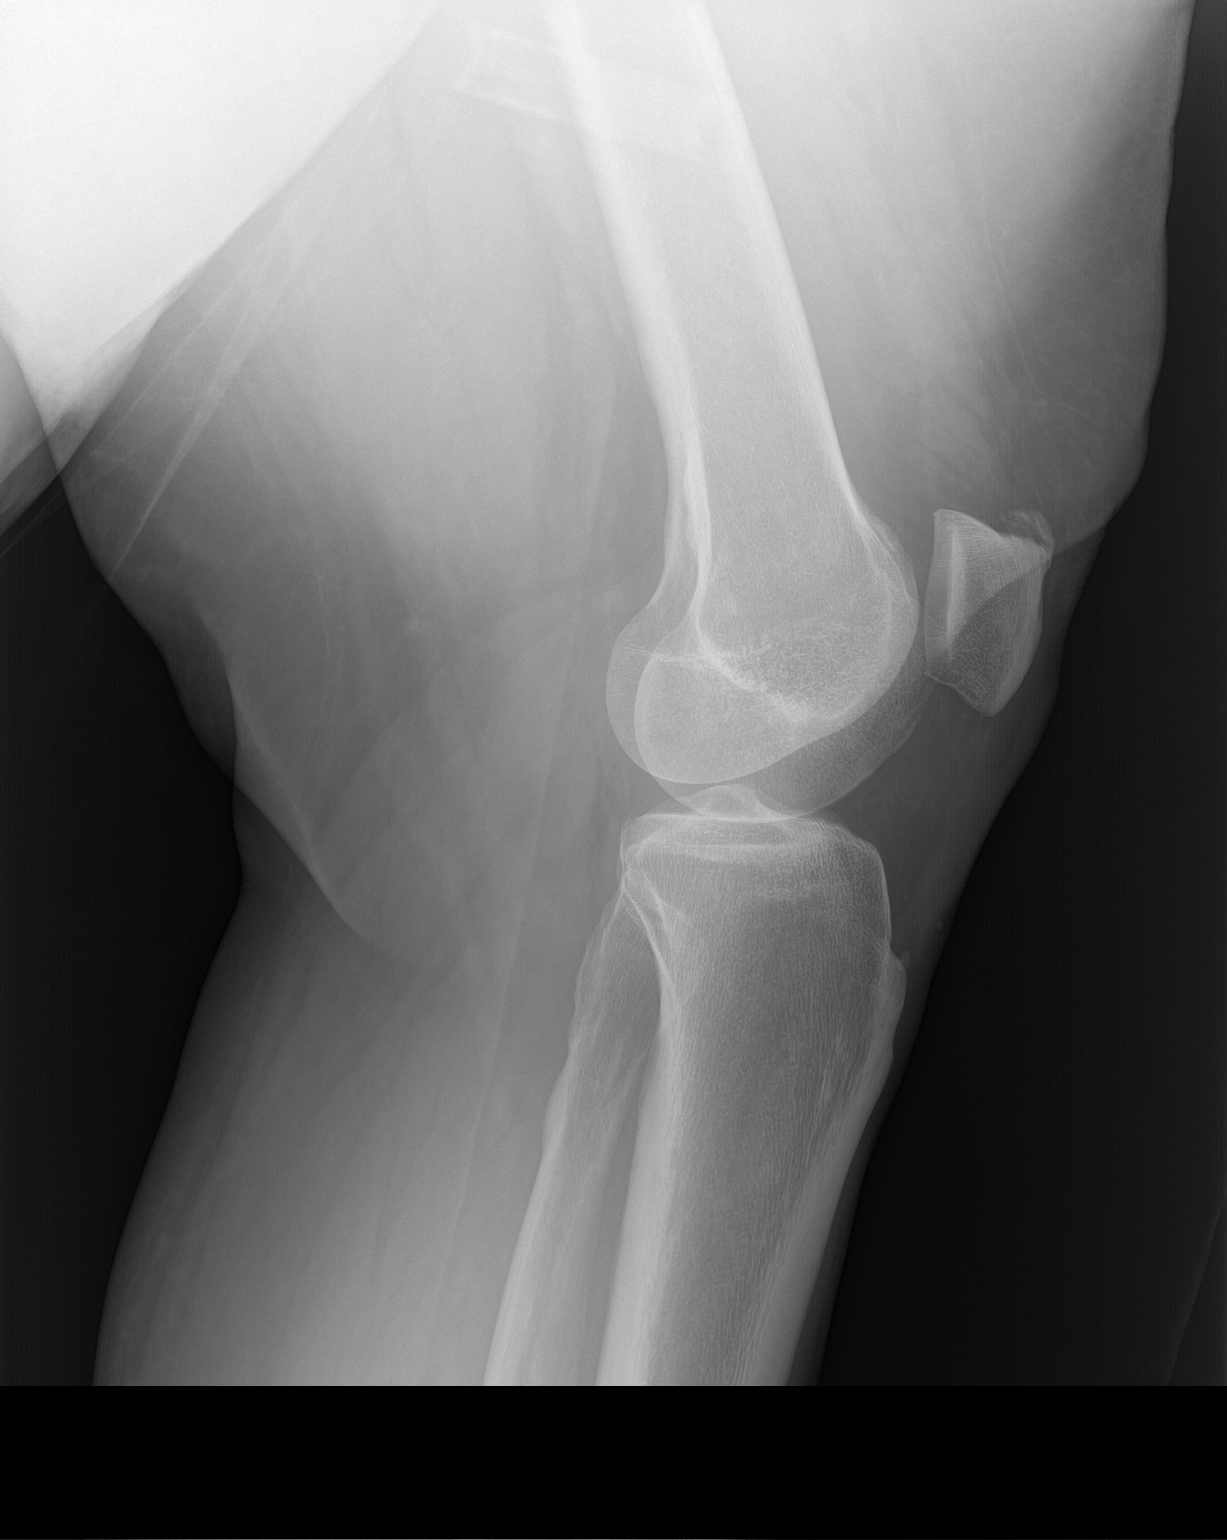

[patella]
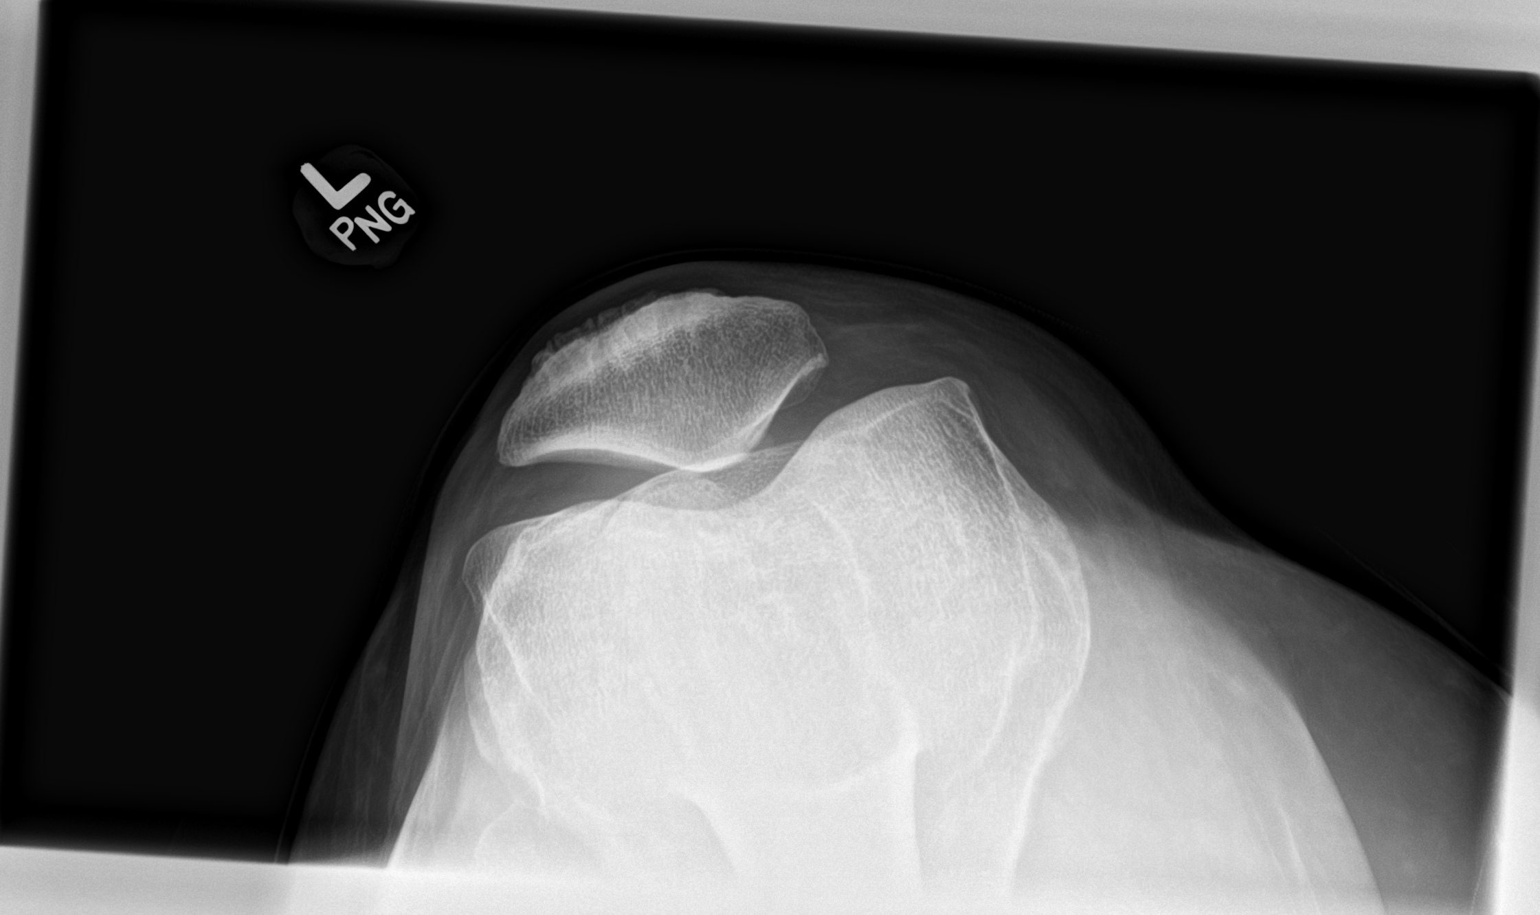

[3 of 3 positions shown; findings below may reference images not displayed]

FINDINGS: No evidence of fracture, dislocation, or joint effusion. No evidence
of arthropathy or other focal bone abnormality. Soft tissues are
unremarkable.
IMPRESSION: Negative left knee radiographs.

## 2021-10-03 ENCOUNTER — Encounter (INDEPENDENT_AMBULATORY_CARE_PROVIDER_SITE_OTHER): Payer: Self-pay
